# Patient Record
Sex: Female | Born: 1995 | Race: Black or African American | Hispanic: No | Marital: Single | Smoking: Never smoker
Health system: Southern US, Community
[De-identification: ages and names within clinical notes are randomized; demographics above are authoritative.]

## PROBLEM LIST (undated history)

## (undated) ENCOUNTER — Ambulatory Visit (HOSPITAL_COMMUNITY): Payer: Self-pay

## (undated) DIAGNOSIS — J45909 Unspecified asthma, uncomplicated: Secondary | ICD-10-CM

---
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
## 2015-01-24 ENCOUNTER — Telehealth: Payer: Self-pay | Admitting: *Deleted

## 2015-01-24 NOTE — Telephone Encounter (Signed)
VM from pt. States that her doctor asked her to call if she starts to have any pain, d/t Nexplanon. States that she has been having abdominal pain. [phone].   TC returned to pt. Pt states that she is having upper stomach pain, has been hurting for the past 5 days. Pt takes tylenol for the pain. States that this is the first time this has happened. States that she had a bm yesterday; no constipation concerns. Recommended pt take Ibuprofen, instead of tylenol for possible cramping. Pt is still able to work through the pain, not so severe that she is skipping activities. Recommended taking Ibuprofen for two days, waiting to see if cramping is occuring before period starts, and for pt to call back if pain worsens, and/or continues.

## 2015-04-02 IMAGING — CR DG CHEST 2V
2 series · 2 of 2 positions shown · non-contrast
Comparison: None.

CLINICAL DATA: Initial evaluation for acute chest pain radiating to
left arm. Dizziness, shortness of breath.

EXAM:
CHEST  2 VIEW

[w chest pa]
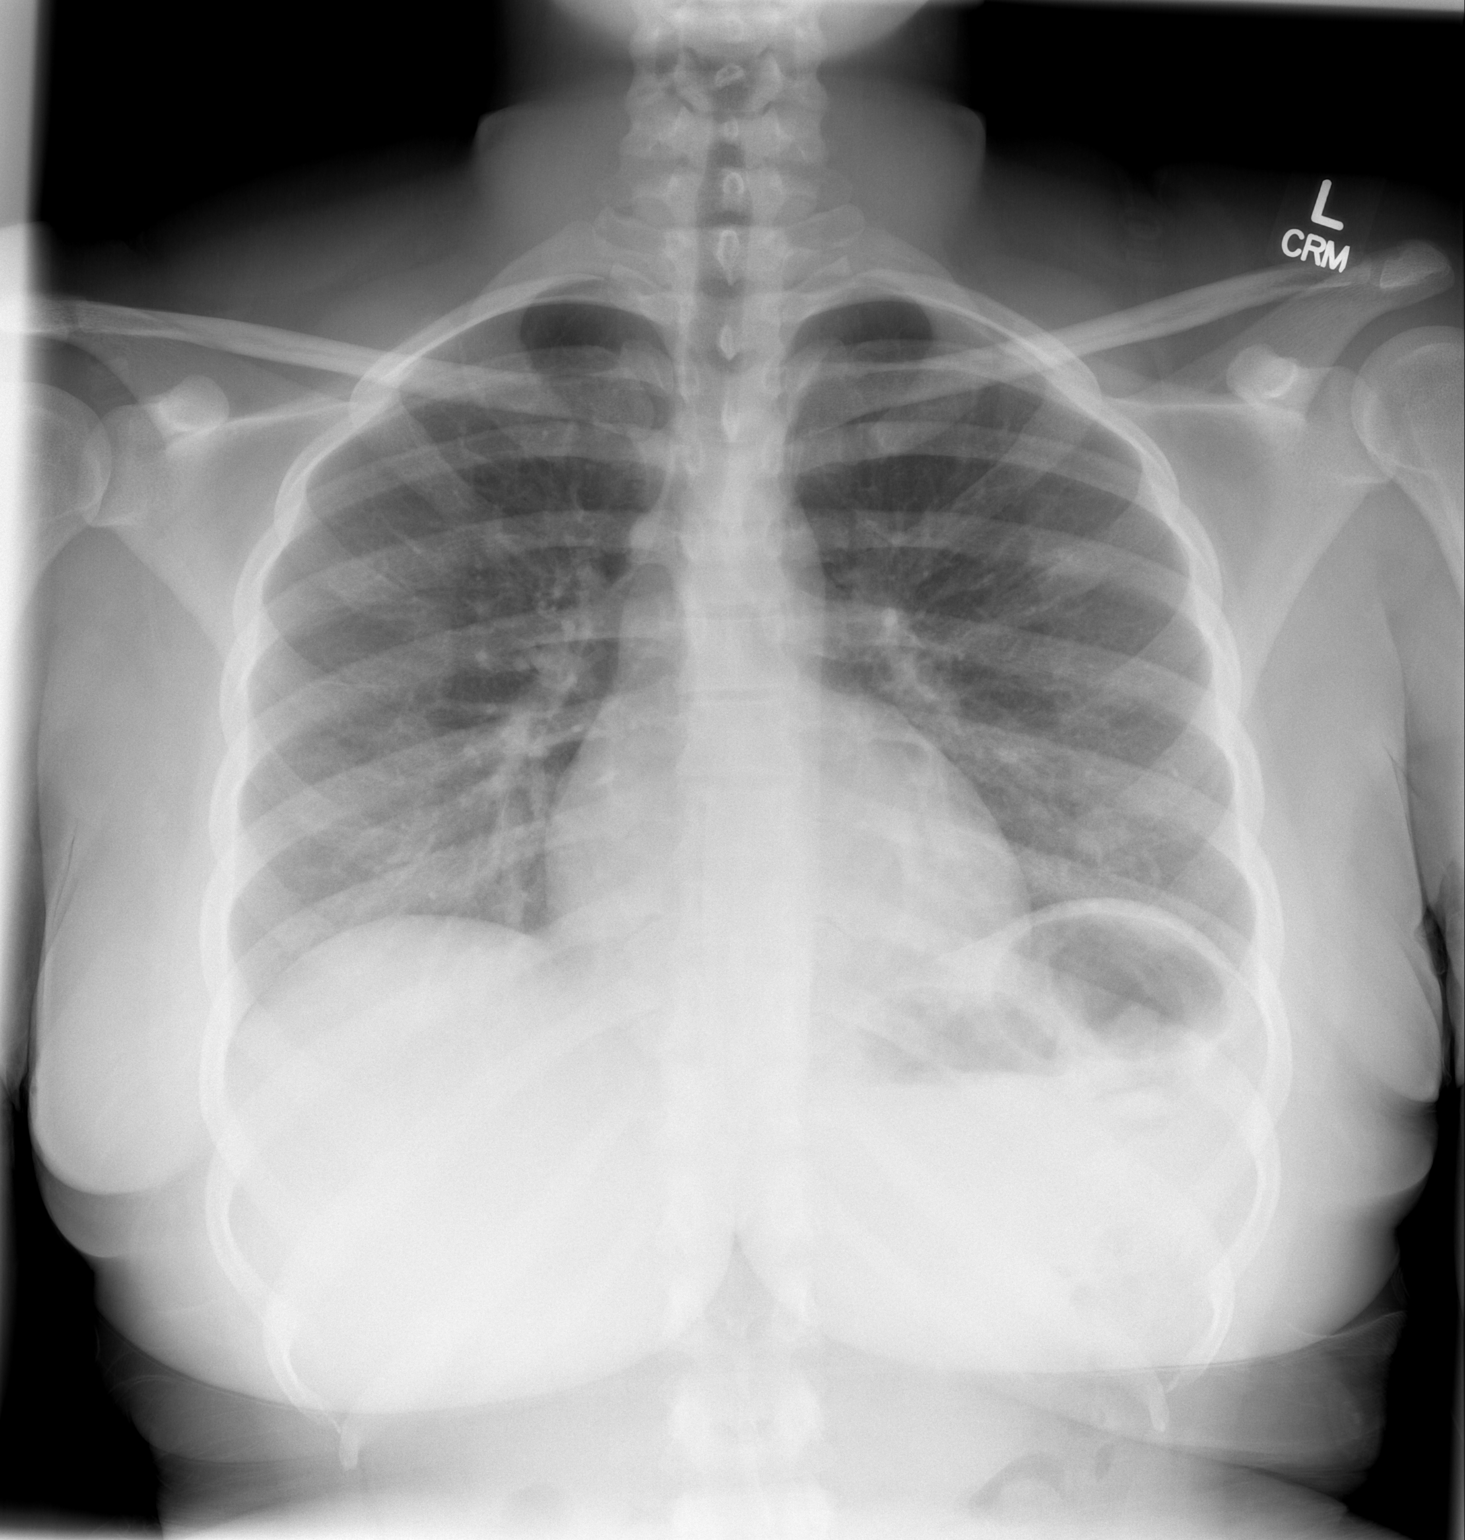

[w chest lat]
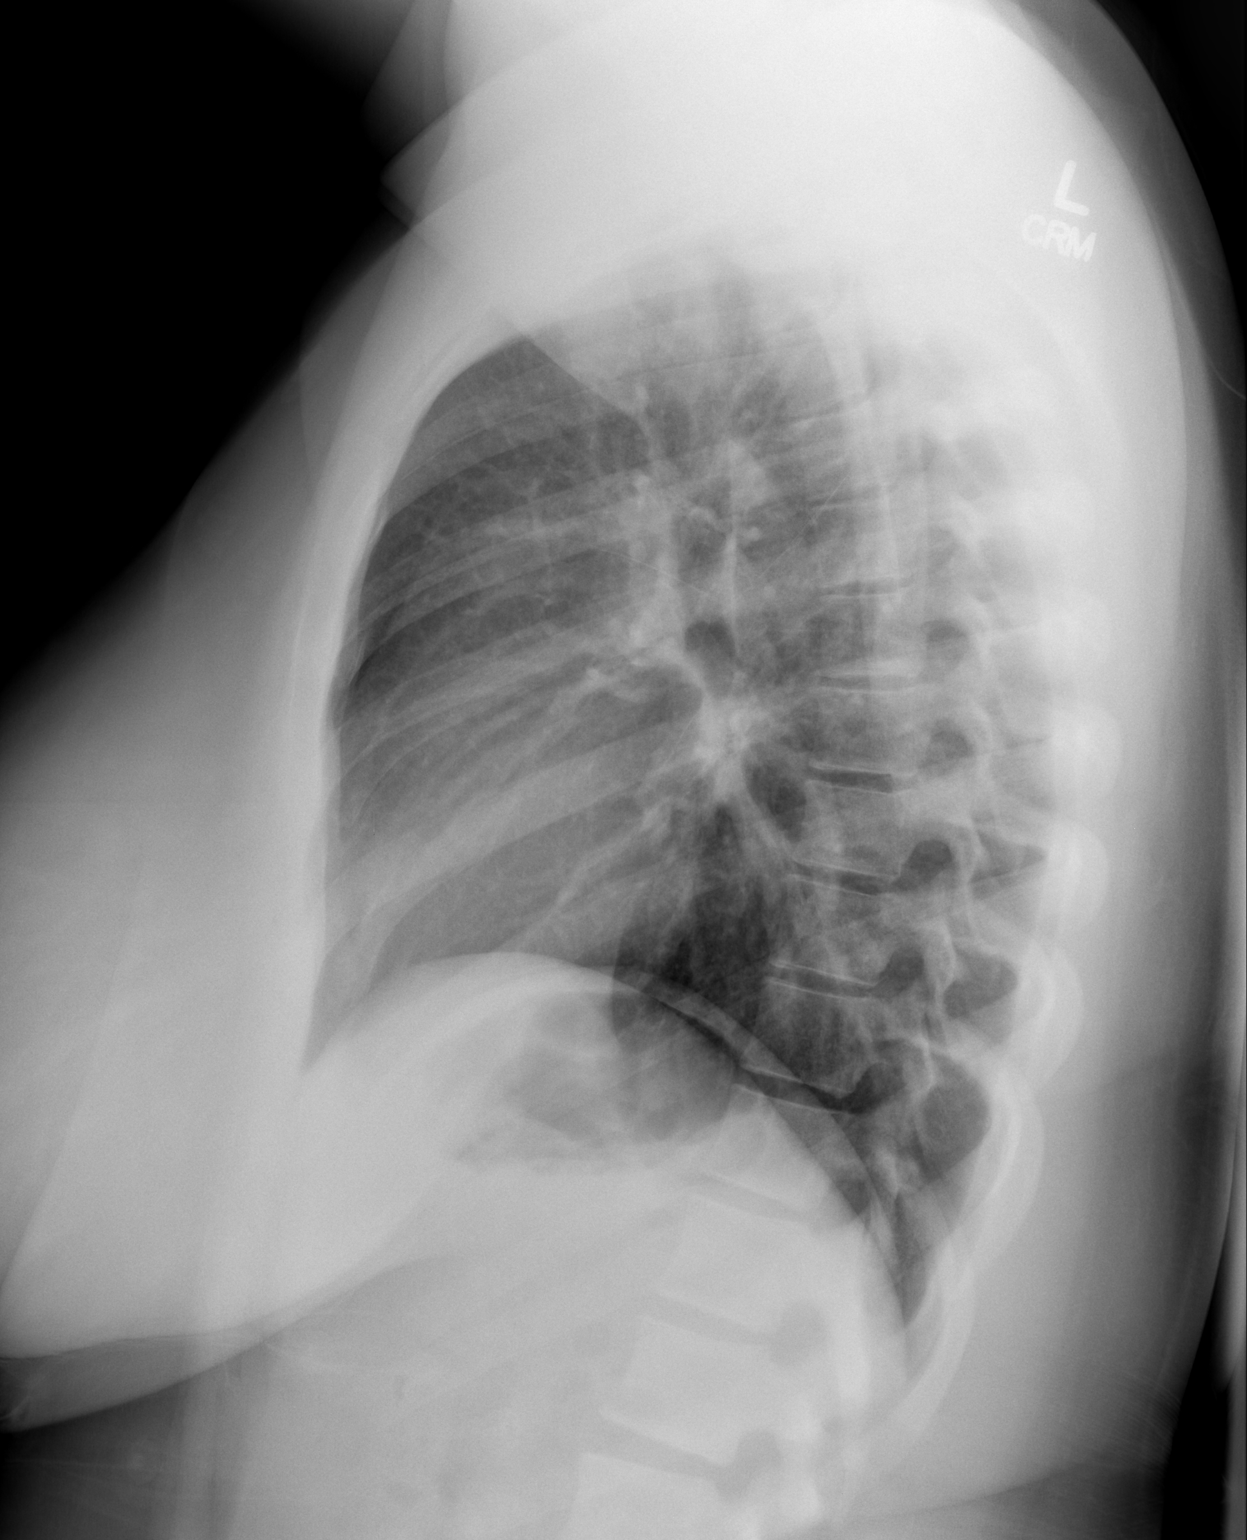

[2 of 2 positions shown; findings below may reference images not displayed]

FINDINGS: The cardiac and mediastinal silhouettes are within normal limits.

The lungs are normally inflated. No airspace consolidation, pleural
effusion, or pulmonary edema is identified. There is no
pneumothorax.

No acute osseous abnormality identified.
IMPRESSION: No active cardiopulmonary disease.

## 2015-05-22 ENCOUNTER — Encounter: Payer: Self-pay | Admitting: Pediatrics

## 2015-05-22 ENCOUNTER — Encounter: Payer: Self-pay | Admitting: Family

## 2015-05-22 ENCOUNTER — Ambulatory Visit (INDEPENDENT_AMBULATORY_CARE_PROVIDER_SITE_OTHER): Payer: Medicaid Other | Admitting: Family

## 2015-05-22 VITALS — BP 105/66 | HR 69 | Ht 63.5 in | Wt 208.2 lb

## 2015-05-22 DIAGNOSIS — N921 Excessive and frequent menstruation with irregular cycle: Secondary | ICD-10-CM | POA: Diagnosis not present

## 2015-05-22 DIAGNOSIS — Z113 Encounter for screening for infections with a predominantly sexual mode of transmission: Secondary | ICD-10-CM

## 2015-05-22 DIAGNOSIS — Z3046 Encounter for surveillance of implantable subdermal contraceptive: Secondary | ICD-10-CM | POA: Diagnosis not present

## 2015-05-22 DIAGNOSIS — Z130 Encounter for screening for diseases of the blood and blood-forming organs and certain disorders involving the immune mechanism: Secondary | ICD-10-CM

## 2015-05-22 DIAGNOSIS — E559 Vitamin D deficiency, unspecified: Secondary | ICD-10-CM | POA: Diagnosis not present

## 2015-05-22 DIAGNOSIS — D509 Iron deficiency anemia, unspecified: Secondary | ICD-10-CM

## 2015-05-22 DIAGNOSIS — Z975 Presence of (intrauterine) contraceptive device: Secondary | ICD-10-CM

## 2015-05-22 LAB — POCT HEMOGLOBIN: HEMOGLOBIN: 11.5 g/dL — AB (ref 12.2–16.2)

## 2015-05-22 MED ORDER — MELOXICAM 7.5 MG PO TABS: ORAL_TABLET | ORAL | Status: AC

## 2015-05-22 MED ORDER — FERROUS SULFATE 325 (65 FE) MG PO TABS: 325.0000 mg | ORAL_TABLET | Freq: Every day | ORAL | Status: DC

## 2015-05-22 NOTE — Progress Notes (Addendum)
THIS RECORD MAY CONTAIN CONFIDENTIAL INFORMATION THAT SHOULD NOT BE RELEASED WITHOUT REVIEW OF THE SERVICE PROVIDER.  Adolescent Medicine Consultation Follow-Up Visit Tiffany Randolph  is a 19 y.o. female referred by Kalman JewelsMcQueen, Shannon, MD here today for follow-up.    Previsit planning completed:  Yes Pre-Visit Planning  Tiffany JuneauRaionna Reinheimer  is a 19 y.o. female referred by Jairo BenMCQUEEN,SHANNON D, MD.   Last seen in Adolescent Medicine Clinic on 11/30/14 for nexplanon placement.   Previous Psych Screenings?  No  Treatment plan at last visit included nexplanon insertion.   Clinical Staff Visit Tasks:   - Urine GC/CT due? Urine and hold if new partner - Psych Screenings Due? No - fingerstick hemoglobin if heavy bleeding   Provider Visit Tasks: - assess nexplanon and any side effects  - eval vit d compliance and recheck if supplement complete - Pertinent Labs? Yes Results for orders placed or performed in visit on 11/30/14  HIV antibody  Result Value Ref Range   HIV 1&2 Ab, 4th Generation NONREACTIVE NONREACTIVE  POCT urine pregnancy  Result Value Ref Range   Preg Test, Ur Negative     Growth Chart Viewed? not applicable   History was provided by the patient.  PCP Confirmed?  Yes, Kalman JewelsShannon McQueen, MD   My Chart Activated?   Pending   HPI:    Three months ago started bleeding daily, after having Nexplanon inserted on 11/30/14. having to wear pantyliners. No true menstrual cycle.  Had kidney infection in August and was on ABXs and pain meds for that. Is wondering if spotting is related.  No vaginal discharge changes, odors, lesions, or new partners.  Mom wants her to ask when she will need a PAP.  ROS otherwise negative.    No LMP recorded. No Known Allergies Current Outpatient Prescriptions on File Prior to Visit  Medication Sig Dispense Refill  . Vitamin D, Ergocalciferol, (DRISDOL) 50000 UNITS CAPS capsule Take 1 capsule (50,000 Units total) by mouth every 7 (seven) days. 12  capsule 0   No current facility-administered medications on file prior to visit.    Social History: School:  in school [employer] in [place], [place]  Nutrition/Eating Behaviors:  No concerns Exercise:  Walking  Sleep:  awakens early, feels rested.   Confidentiality was discussed with the patient and if applicable, with caregiver as well.  Patient's personal or confidential phone number: [phone] Tobacco?  no Drugs/ETOH?  no Partner preference?  female Sexually Active?  yes   Pregnancy Prevention:  implant, reviewed condoms & plan B Safe at home, in school & in relationships?  Yes Safe to self?  Yes    The following portions of the patient's history were reviewed and updated as appropriate: allergies, current medications, past family history, past medical history, past social history, past surgical history and problem list.  Physical Exam:  Filed Vitals:   05/22/15 1048  BP: 105/66  Pulse: 69  Height: 5' 3.5" (1.613 m)  Weight: 208 lb 3.2 oz (94.439 kg)   BP 105/66 mmHg  Pulse 69  Ht 5' 3.5" (1.613 m)  Wt 208 lb 3.2 oz (94.439 kg)  BMI 36.30 kg/m2  LMP  (LMP Unknown) Body mass index: body mass index is 36.3 kg/(m^2). Blood pressure percentiles are 34% systolic and 58% diastolic based on 2000 NHANES data. Blood pressure percentile targets: 90: 123/78, 95: 127/82, 99 + 5 mmHg: 139/94.  Physical Exam  Constitutional: She is oriented to person, place, and time. She appears well-developed. No distress.  HENT:  Head: Normocephalic and atraumatic.  Eyes: EOM are normal. Pupils are equal, round, and reactive to light. No scleral icterus.  Neck: Normal range of motion. Neck supple. No thyromegaly present.  Cardiovascular: Normal rate, regular rhythm, normal heart sounds and intact distal pulses.   No murmur heard. Pulmonary/Chest: Effort normal and breath sounds normal.  Abdominal: Soft.  Musculoskeletal: Normal range of motion. She exhibits no edema.  Lymphadenopathy:     She has no cervical adenopathy.  Neurological: She is alert and oriented to person, place, and time. No cranial nerve deficit.  Skin: Skin is warm and dry. No rash noted.  Psychiatric: She has a normal mood and affect. Her behavior is normal. Judgment and thought content normal.  Vitals reviewed.    Assessment/Plan:  1. Breakthrough bleeding on Nexplanon -likely r/t Nexplanon.  -discussed Mobic use or OCPs; pt elects to try Mobic first.  - meloxicam (MOBIC) 7.5 MG tablet; Take 1 tablet (7.5mg ) by mouth with food once daily for 10 days.  Dispense: 30 tablet; Refill: 0  2. Iron deficiency anemia - ferrous sulfate ([facility] IRON) 325 (65 FE) MG tablet; Take 1 tablet (325 mg total) by mouth daily with breakfast.  Dispense: 30 tablet; Refill: 3  3. Vitamin D deficiency - 9 on 12/01/14; states she took Vit D from Pharmacy and was out in the sun during summer.  -Will recheck today and advise.  - VITAMIN D 25 Hydroxy (Vit-D Deficiency, Fractures)  4. Surveillance of implantable subdermal contraceptive -no concerns other than BTB -condom use reviewed; condoms given.   5. Screening for iron deficiency anemia 11.5; begin Fe supplements; iron-rich foods. Mobic to control BTB on Nexplanon.  - POCT hemoglobin  6. Routine screening for STI (sexually transmitted infection) -r/o cause of BTB - GC/chlamydia probe amp, urine  Discussed PAP guidelines with patient; screenings start at 19 yo. Patient verbalized understanding with no further questions.   Follow-up:  Return in about 4 weeks (around 06/19/2015), or schedule when she is home for winter break, for medication follow-up, GYN/Reproductive Health concerns.   Medical decision-making:  > 15 minutes spent, more than 50% of appointment was spent discussing diagnosis and management of symptoms

## 2015-05-22 NOTE — Progress Notes (Signed)
Pre-Visit Planning  Tiffany Randolph  is a 19 y.o. female referred by Jairo BenMCQUEEN,SHANNON D, MD.   Last seen in Adolescent Medicine Clinic on 11/30/14 for nexplanon placement.   Previous Psych Screenings?  No  Treatment plan at last visit included nexplanon insertion.   Clinical Staff Visit Tasks:   - Urine GC/CT due? Urine and hold if new partner - Psych Screenings Due? No - fingerstick hemoglobin if heavy bleeding   Provider Visit Tasks: - assess nexplanon and any side effects  - eval vit d compliance and recheck if supplement complete - Pertinent Labs? Yes Results for orders placed or performed in visit on 11/30/14  HIV antibody  Result Value Ref Range   HIV 1&2 Ab, 4th Generation NONREACTIVE NONREACTIVE  POCT urine pregnancy  Result Value Ref Range   Preg Test, Ur Negative

## 2015-05-23 LAB — GC/CHLAMYDIA PROBE AMP, URINE
Chlamydia, Swab/Urine, PCR: NEGATIVE
GC Probe Amp, Urine: NEGATIVE

## 2015-05-23 LAB — VITAMIN D 25 HYDROXY (VIT D DEFICIENCY, FRACTURES): Vit D, 25-Hydroxy: 10 ng/mL — ABNORMAL LOW (ref 30–100)

## 2015-05-27 ENCOUNTER — Telehealth: Payer: Self-pay | Admitting: *Deleted

## 2015-05-27 NOTE — Telephone Encounter (Signed)
-----   Message from Christianne Dolinhristy Millican, NP sent at 05/23/2015  6:01 PM EST ----- Negative gc/c. Notify patient.

## 2015-05-27 NOTE — Telephone Encounter (Signed)
TC to pt updated of GC/C. Pt verbalized understanding, had question regarding her vit D level. Checked vit D level, advised pt it was still low, and to pick up vit D to take daily until next OV.

## 2015-06-17 ENCOUNTER — Encounter: Payer: Self-pay | Admitting: Family

## 2015-06-17 NOTE — Progress Notes (Signed)
Opened in error. See Documentation note from today with PVP.

## 2015-06-17 NOTE — Progress Notes (Signed)
Patient ID: Fidela JuneauRaionna Piacente, female   DOB: 05/13/1996, 19 y.o.   MRN: [id number] Pre-Visit Planning  Fidela JuneauRaionna Ells  is a 19 y.o. female referred by Jairo BenMCQUEEN,SHANNON D, MD.   Last seen in Adolescent Medicine Clinic on 05/22/15 for BTB on Nexplanon.       Treatment plan at last visit included Mobic for bleeding control, iron supplementation. Vit D level (10) - will need to start high-dose Vit D supplementation.   Clinical Staff Visit Tasks:   - Urine GC/CT due? no - Psych Screenings Due? no - fs hgb      Provider Visit Tasks: - Review Vit D results with patient; discuss high-dose supplementation -reassess BTB, Mobic use, consider OCPs if still bleeding - [facility] Inv[facility]ment? No - Pertinent Labs? Yes, vit D (10)

## 2015-06-18 ENCOUNTER — Ambulatory Visit: Payer: Medicaid Other | Admitting: Family

## 2016-01-31 ENCOUNTER — Encounter: Payer: Self-pay | Admitting: Family

## 2016-01-31 ENCOUNTER — Ambulatory Visit (INDEPENDENT_AMBULATORY_CARE_PROVIDER_SITE_OTHER): Payer: Medicaid Other | Admitting: Family

## 2016-01-31 VITALS — BP 109/77 | HR 63 | Ht 64.0 in | Wt 233.6 lb

## 2016-01-31 DIAGNOSIS — R6889 Other general symptoms and signs: Secondary | ICD-10-CM

## 2016-01-31 DIAGNOSIS — Z3202 Encounter for pregnancy test, result negative: Secondary | ICD-10-CM

## 2016-01-31 DIAGNOSIS — R631 Polydipsia: Secondary | ICD-10-CM

## 2016-01-31 DIAGNOSIS — R94120 Abnormal auditory function study: Secondary | ICD-10-CM

## 2016-01-31 DIAGNOSIS — R635 Abnormal weight gain: Secondary | ICD-10-CM | POA: Diagnosis not present

## 2016-01-31 DIAGNOSIS — H579 Unspecified disorder of eye and adnexa: Secondary | ICD-10-CM | POA: Diagnosis not present

## 2016-01-31 DIAGNOSIS — Z0001 Encounter for general adult medical examination with abnormal findings: Secondary | ICD-10-CM

## 2016-01-31 DIAGNOSIS — Z0101 Encounter for examination of eyes and vision with abnormal findings: Secondary | ICD-10-CM

## 2016-01-31 DIAGNOSIS — Z113 Encounter for screening for infections with a predominantly sexual mode of transmission: Secondary | ICD-10-CM | POA: Diagnosis not present

## 2016-01-31 LAB — CBC WITH DIFFERENTIAL/PLATELET
BASOS PCT: 1 %
Basophils Absolute: 49 cells/uL (ref 0–200)
EOS ABS: 98 {cells}/uL (ref 15–500)
EOS PCT: 2 %
HCT: 38.3 % (ref 35.0–45.0)
Hemoglobin: 12.4 g/dL (ref 11.7–15.5)
Lymphocytes Relative: 31 %
Lymphs Abs: 1519 cells/uL (ref 850–3900)
MCH: 28.9 pg (ref 27.0–33.0)
MCHC: 32.4 g/dL (ref 32.0–36.0)
MCV: 89.3 fL (ref 80.0–100.0)
MONOS PCT: 6 %
MPV: 9.3 fL (ref 7.5–12.5)
Monocytes Absolute: 294 cells/uL (ref 200–950)
NEUTROS ABS: 2940 {cells}/uL (ref 1500–7800)
Neutrophils Relative %: 60 %
PLATELETS: 343 10*3/uL (ref 140–400)
RBC: 4.29 MIL/uL (ref 3.80–5.10)
RDW: 13 % (ref 11.0–15.0)
WBC: 4.9 10*3/uL (ref 3.8–10.8)

## 2016-01-31 LAB — TSH: TSH: 1 m[IU]/L (ref 0.50–4.30)

## 2016-01-31 LAB — COMPREHENSIVE METABOLIC PANEL
ALT: 12 U/L (ref 5–32)
AST: 13 U/L (ref 12–32)
Albumin: 3.6 g/dL (ref 3.6–5.1)
Alkaline Phosphatase: 54 U/L (ref 47–176)
BUN: 9 mg/dL (ref 7–20)
CHLORIDE: 105 mmol/L (ref 98–110)
CO2: 27 mmol/L (ref 20–31)
CREATININE: 0.66 mg/dL (ref 0.50–1.00)
Calcium: 8.9 mg/dL (ref 8.9–10.4)
Glucose, Bld: 83 mg/dL (ref 65–99)
Potassium: 4.5 mmol/L (ref 3.8–5.1)
SODIUM: 136 mmol/L (ref 135–146)
TOTAL PROTEIN: 6.6 g/dL (ref 6.3–8.2)
Total Bilirubin: 0.3 mg/dL (ref 0.2–1.1)

## 2016-01-31 LAB — LIPID PANEL
CHOLESTEROL: 169 mg/dL (ref 125–170)
HDL: 46 mg/dL (ref 36–76)
LDL Cholesterol: 113 mg/dL — ABNORMAL HIGH (ref <110)
Total CHOL/HDL Ratio: 3.7 Ratio (ref <=5.0)
Triglycerides: 49 mg/dL (ref 40–136)
VLDL: 10 mg/dL (ref <30)

## 2016-01-31 LAB — HEMOGLOBIN A1C
Hgb A1c MFr Bld: 5.7 % — ABNORMAL HIGH (ref <5.7)
MEAN PLASMA GLUCOSE: 117 mg/dL

## 2016-01-31 NOTE — Progress Notes (Signed)
Routine Well-Adolescent Visit   History was provided by the patient.  Tiffany Randolph is a 20 y.o. female who is here for routine wellness exam. PCP Confirmed?  yes  Jairo Ben, MD  Previsit planning completed:  no  Growth Chart Viewed? no  HPI:  Nexplanon has been fine; had spotting before - now just having a regular cycle every 2 months, lasting about a week. No cramping. Not currently sexually active.   About 3 months ago had rash on both inner thighs - similar occurrence at 35 yo - grandmother gave her diabetic lotion and it went away. It was itchy, completely resolved.   CC: dad has ALS, concerning. Dad wants her to be checked. Vitamin D unsure if still low. Missed last opthalmology referral.  Dental Care: last week. Root canal needed soon.   Patient's last menstrual period was 01/29/2016 (exact date).  Menstrual History:   ROS:  Review of Systems  Constitutional: Negative.   HENT: Negative for nosebleeds, sore throat and tinnitus.   Eyes: Positive for blurred vision. Negative for photophobia, pain and discharge.  Respiratory: Negative.   Cardiovascular: Negative.   Gastrointestinal: Negative.   Genitourinary: Negative.   Musculoskeletal: Negative.   Skin: Negative.   Neurological: Negative.  Negative for headaches.  Endo/Heme/Allergies: Positive for polydipsia (3 x nightly ).  Psychiatric/Behavioral: The patient has insomnia.    The following portions of the patient's history were reviewed and updated as appropriate: allergies, current medications, past family history, past medical history, past social history, past surgical history and problem list.  No Known Allergies  Past Medical History:   Mild asthma - rescue inhaler only, no recent use.  No other medications.  No surgeries,  Past Medical History:  Diagnosis Date  . Asthma     Family History:  Father: ALS Maternal Aunt and Maternal GGM: breast cancer Paternal GM, + two of GM's siblings  No  family history on file.  Social History: Lives with: mom, 2 sis, stepdad, dog Parental relations: great Siblings: 8 sister, 1 brother Friends/Peers: good  School: spring at [employer]  Futrure Plans: Engineer, maintenance (IT) Nutrition/Eating Behaviors: picky about what she eats, eats what she likes - veggies; french fries, fried chicken  Sports/Exercise:  Gym, starting going a week ago  Screen time: mostly all day on phone Sleep: still not great; tried to change routine with phone; tries not to watch TV. Takes Tylenol PM - not daily - approx 10 pm sleep - wakes up throughout night - 5 hrs total   Confidentiality was discussed with the patient and if applicable, with caregiver as well.  Patient's personal or confidential phone number: [phone] Tobacco? no Secondhand smoke exposure?no Drugs/EtOH?no Sexually active?no Pregnancy Prevention: nexplanon, reviewed condoms & plan B Safe at home, in school & in relationships? Yes Guns in the home? no Safe to self? Yes  Physical Exam:  Vitals:   01/31/16 1002  BP: 109/77  Pulse: 63  Weight: 233 lb 9.6 oz (106 kg)  Height:  (1.626 m)   BP 109/77 (BP Location: Left Arm, Patient Position: Sitting, Cuff Size: Large)   Pulse 63   Ht  (1.626 m)   Wt 233 lb 9.6 oz (106 kg)   LMP 01/29/2016 (Exact Date)   BMI 40.10 kg/m  Body mass index: body mass index is 40.1 kg/m.  Blood pressure percentiles are 53.3 % systolic and 91.0 % diastolic based on NHBPEP's 4th Report.   Physical Exam  Constitutional: She is oriented to person,  place, and time. She appears well-developed and well-nourished. No distress.  Obese   Eyes: EOM are normal. Pupils are equal, round, and reactive to light. No scleral icterus.  Neck: Normal range of motion. Neck supple. No thyromegaly present.  Cardiovascular: Normal rate, regular rhythm, normal heart sounds and intact distal pulses.   No murmur heard. Pulmonary/Chest: Effort normal and breath sounds normal.   Abdominal: Soft. There is no tenderness. There is no guarding.  Musculoskeletal: Normal range of motion. She exhibits no edema or tenderness.  Lymphadenopathy:    She has no cervical adenopathy.  Neurological: She is alert and oriented to person, place, and time. No cranial nerve deficit.  Skin: Skin is warm and dry. No rash noted.  Acanthosis nigricans - posterior neck folds  Psychiatric: She has a normal mood and affect.  Nursing note and vitals reviewed.  Assessment/Plan: 1. Encounter for general adult medical examination with abnormal findings -significant findings today include concern for insulin resistance, failed auditory and vision screenings. Will refer.  -reviewed labs to collect today; will call with results.    2. Failed hearing screening -refer to audiology   3. Failed vision screen -refer to opthalmology   4. Polydipsia -concern for prediabetes; reviewed risk factors.  - Hemoglobin A1c - Lipid panel - CBC with Differential/Platelet  5. Pregnancy examination or test, negative result -Per protocol  - POCT urine pregnancy  6. Weight gain -monitoring labs; reviewed lifestyle changes related to exercise/diet  - Comprehensive metabolic panel - TSH  7. Routine screening for STI (sexually transmitted infection) Per protocol  - GC/Chlamydia Probe Amp - HIV antibody    Return pending lab results. Otherwise, as needed. ..  Letitia Neri, NP

## 2016-02-01 LAB — GC/CHLAMYDIA PROBE AMP
CT Probe RNA: NOT DETECTED
GC Probe RNA: NOT DETECTED

## 2016-02-01 LAB — HIV ANTIBODY (ROUTINE TESTING W REFLEX): HIV: NONREACTIVE

## 2016-02-11 ENCOUNTER — Telehealth: Payer: Self-pay

## 2016-02-11 NOTE — Telephone Encounter (Signed)
Called patient back after they were wanting results of labs from a few weeks ago. Was disconnected in between conversation bullet pt know her results are available on mychart for review. Called patient back after disconnect, no answer , left VM.

## 2016-04-28 ENCOUNTER — Ambulatory Visit: Payer: Medicaid Other | Attending: Family | Admitting: Audiology

## 2017-03-31 ENCOUNTER — Ambulatory Visit: Payer: Medicaid Other | Admitting: Pediatrics

## 2018-07-06 DIAGNOSIS — Z0110 Encounter for examination of ears and hearing without abnormal findings: Secondary | ICD-10-CM | POA: Diagnosis not present

## 2018-07-06 DIAGNOSIS — Z136 Encounter for screening for cardiovascular disorders: Secondary | ICD-10-CM | POA: Diagnosis not present

## 2018-07-06 DIAGNOSIS — Z0100 Encounter for examination of eyes and vision without abnormal findings: Secondary | ICD-10-CM | POA: Diagnosis not present

## 2018-07-06 DIAGNOSIS — Z131 Encounter for screening for diabetes mellitus: Secondary | ICD-10-CM | POA: Diagnosis not present

## 2018-07-06 DIAGNOSIS — N3000 Acute cystitis without hematuria: Secondary | ICD-10-CM | POA: Diagnosis not present

## 2018-07-06 DIAGNOSIS — Z0000 Encounter for general adult medical examination without abnormal findings: Secondary | ICD-10-CM | POA: Diagnosis not present

## 2018-11-29 DIAGNOSIS — E559 Vitamin D deficiency, unspecified: Secondary | ICD-10-CM | POA: Diagnosis not present

## 2019-01-05 DIAGNOSIS — Z113 Encounter for screening for infections with a predominantly sexual mode of transmission: Secondary | ICD-10-CM | POA: Diagnosis not present

## 2019-01-05 DIAGNOSIS — Z01419 Encounter for gynecological examination (general) (routine) without abnormal findings: Secondary | ICD-10-CM | POA: Diagnosis not present

## 2019-01-05 DIAGNOSIS — Z3046 Encounter for surveillance of implantable subdermal contraceptive: Secondary | ICD-10-CM | POA: Diagnosis not present

## 2019-01-05 DIAGNOSIS — Z1151 Encounter for screening for human papillomavirus (HPV): Secondary | ICD-10-CM | POA: Diagnosis not present

## 2019-01-12 DIAGNOSIS — Z3040 Encounter for surveillance of contraceptives, unspecified: Secondary | ICD-10-CM | POA: Diagnosis not present

## 2019-01-12 DIAGNOSIS — Z3046 Encounter for surveillance of implantable subdermal contraceptive: Secondary | ICD-10-CM | POA: Diagnosis not present

## 2019-03-24 DIAGNOSIS — Z23 Encounter for immunization: Secondary | ICD-10-CM | POA: Diagnosis not present

## 2019-03-24 DIAGNOSIS — N920 Excessive and frequent menstruation with regular cycle: Secondary | ICD-10-CM | POA: Diagnosis not present

## 2019-03-24 DIAGNOSIS — E559 Vitamin D deficiency, unspecified: Secondary | ICD-10-CM | POA: Diagnosis not present

## 2019-07-20 DIAGNOSIS — Z0000 Encounter for general adult medical examination without abnormal findings: Secondary | ICD-10-CM | POA: Diagnosis not present

## 2019-07-20 DIAGNOSIS — E559 Vitamin D deficiency, unspecified: Secondary | ICD-10-CM | POA: Diagnosis not present

## 2019-07-25 DIAGNOSIS — Z1331 Encounter for screening for depression: Secondary | ICD-10-CM | POA: Diagnosis not present

## 2019-07-25 DIAGNOSIS — R21 Rash and other nonspecific skin eruption: Secondary | ICD-10-CM | POA: Diagnosis not present

## 2019-07-25 DIAGNOSIS — Z0000 Encounter for general adult medical examination without abnormal findings: Secondary | ICD-10-CM | POA: Diagnosis not present

## 2019-07-25 DIAGNOSIS — E559 Vitamin D deficiency, unspecified: Secondary | ICD-10-CM | POA: Diagnosis not present

## 2019-07-25 DIAGNOSIS — A749 Chlamydial infection, unspecified: Secondary | ICD-10-CM | POA: Diagnosis not present

## 2019-07-26 DIAGNOSIS — Z0000 Encounter for general adult medical examination without abnormal findings: Secondary | ICD-10-CM | POA: Diagnosis not present

## 2019-07-26 DIAGNOSIS — E559 Vitamin D deficiency, unspecified: Secondary | ICD-10-CM | POA: Diagnosis not present

## 2019-10-27 DIAGNOSIS — E559 Vitamin D deficiency, unspecified: Secondary | ICD-10-CM | POA: Diagnosis not present

## 2019-10-27 DIAGNOSIS — L309 Dermatitis, unspecified: Secondary | ICD-10-CM | POA: Diagnosis not present

## 2019-12-27 ENCOUNTER — Ambulatory Visit (INDEPENDENT_AMBULATORY_CARE_PROVIDER_SITE_OTHER): Payer: Medicaid Other

## 2019-12-27 DIAGNOSIS — Z23 Encounter for immunization: Secondary | ICD-10-CM

## 2020-01-17 ENCOUNTER — Ambulatory Visit: Payer: Medicaid Other

## 2020-01-22 ENCOUNTER — Ambulatory Visit (INDEPENDENT_AMBULATORY_CARE_PROVIDER_SITE_OTHER): Payer: Self-pay

## 2020-01-22 ENCOUNTER — Other Ambulatory Visit: Payer: Self-pay

## 2020-01-22 VITALS — Wt 226.8 lb

## 2020-01-22 DIAGNOSIS — Z23 Encounter for immunization: Secondary | ICD-10-CM

## 2020-01-22 NOTE — Progress Notes (Signed)
   Covid-19 Vaccination Clinic  Name:  Tiffany Randolph    MRN: [id number] DOB: Jun 11, 1996  01/22/2020  [name] was observed post Covid-19 immunization for 15 minutes without incident. She was provided with Vaccine Information Sheet and instruction to access the V-Safe system.   [name] was instructed to call 911 with any severe reactions post vaccine: Marland Kitchen Difficulty breathing  . Swelling of face and throat  . A fast heartbeat  . A bad rash all over body  . Dizziness and weakness   Immunizations Administered    Name Date Dose VIS Date Route   Pfizer COVID-19 Vaccine 01/22/2020  9:25 AM 0.3 mL 08/23/2018 Intramuscular   Manufacturer: [employer], [employer]   Lot: [id number]   NDC: [address]-9

## 2020-01-30 ENCOUNTER — Encounter: Payer: Self-pay | Admitting: Pediatrics

## 2022-02-19 DIAGNOSIS — Z113 Encounter for screening for infections with a predominantly sexual mode of transmission: Secondary | ICD-10-CM | POA: Diagnosis not present

## 2022-02-19 DIAGNOSIS — R519 Headache, unspecified: Secondary | ICD-10-CM | POA: Diagnosis not present

## 2022-02-19 DIAGNOSIS — Z136 Encounter for screening for cardiovascular disorders: Secondary | ICD-10-CM | POA: Diagnosis not present

## 2022-02-19 DIAGNOSIS — K219 Gastro-esophageal reflux disease without esophagitis: Secondary | ICD-10-CM | POA: Diagnosis not present

## 2022-02-19 DIAGNOSIS — Z0001 Encounter for general adult medical examination with abnormal findings: Secondary | ICD-10-CM | POA: Diagnosis not present

## 2022-02-19 DIAGNOSIS — Z131 Encounter for screening for diabetes mellitus: Secondary | ICD-10-CM | POA: Diagnosis not present

## 2022-02-26 DIAGNOSIS — Z136 Encounter for screening for cardiovascular disorders: Secondary | ICD-10-CM | POA: Diagnosis not present

## 2022-02-26 DIAGNOSIS — Z0001 Encounter for general adult medical examination with abnormal findings: Secondary | ICD-10-CM | POA: Diagnosis not present

## 2022-02-26 DIAGNOSIS — Z113 Encounter for screening for infections with a predominantly sexual mode of transmission: Secondary | ICD-10-CM | POA: Diagnosis not present

## 2022-02-26 DIAGNOSIS — Z131 Encounter for screening for diabetes mellitus: Secondary | ICD-10-CM | POA: Diagnosis not present

## 2022-05-01 DIAGNOSIS — L210 Seborrhea capitis: Secondary | ICD-10-CM | POA: Diagnosis not present

## 2022-05-01 DIAGNOSIS — D649 Anemia, unspecified: Secondary | ICD-10-CM | POA: Diagnosis not present

## 2022-05-01 DIAGNOSIS — E785 Hyperlipidemia, unspecified: Secondary | ICD-10-CM | POA: Diagnosis not present

## 2022-05-01 DIAGNOSIS — Z124 Encounter for screening for malignant neoplasm of cervix: Secondary | ICD-10-CM | POA: Diagnosis not present

## 2022-05-01 DIAGNOSIS — Z0001 Encounter for general adult medical examination with abnormal findings: Secondary | ICD-10-CM | POA: Diagnosis not present

## 2022-05-01 DIAGNOSIS — Z23 Encounter for immunization: Secondary | ICD-10-CM | POA: Diagnosis not present

## 2022-06-02 DIAGNOSIS — K219 Gastro-esophageal reflux disease without esophagitis: Secondary | ICD-10-CM | POA: Diagnosis not present

## 2022-06-02 DIAGNOSIS — D649 Anemia, unspecified: Secondary | ICD-10-CM | POA: Diagnosis not present

## 2022-06-02 DIAGNOSIS — Z793 Long term (current) use of hormonal contraceptives: Secondary | ICD-10-CM | POA: Diagnosis not present

## 2022-06-11 DIAGNOSIS — Z01419 Encounter for gynecological examination (general) (routine) without abnormal findings: Secondary | ICD-10-CM | POA: Diagnosis not present

## 2022-06-11 DIAGNOSIS — Z1151 Encounter for screening for human papillomavirus (HPV): Secondary | ICD-10-CM | POA: Diagnosis not present

## 2022-06-11 DIAGNOSIS — Z8042 Family history of malignant neoplasm of prostate: Secondary | ICD-10-CM | POA: Diagnosis not present

## 2022-06-11 DIAGNOSIS — Z124 Encounter for screening for malignant neoplasm of cervix: Secondary | ICD-10-CM | POA: Diagnosis not present

## 2022-06-11 DIAGNOSIS — Z803 Family history of malignant neoplasm of breast: Secondary | ICD-10-CM | POA: Diagnosis not present

## 2022-06-11 DIAGNOSIS — Z6841 Body Mass Index (BMI) 40.0 and over, adult: Secondary | ICD-10-CM | POA: Diagnosis not present

## 2022-06-25 DIAGNOSIS — Z3046 Encounter for surveillance of implantable subdermal contraceptive: Secondary | ICD-10-CM | POA: Diagnosis not present

## 2022-07-17 DIAGNOSIS — K219 Gastro-esophageal reflux disease without esophagitis: Secondary | ICD-10-CM | POA: Diagnosis not present

## 2022-07-17 DIAGNOSIS — Z793 Long term (current) use of hormonal contraceptives: Secondary | ICD-10-CM | POA: Diagnosis not present

## 2022-07-17 DIAGNOSIS — D649 Anemia, unspecified: Secondary | ICD-10-CM | POA: Diagnosis not present

## 2022-08-06 ENCOUNTER — Ambulatory Visit: Payer: BC Managed Care – PPO | Admitting: Plastic Surgery

## 2022-08-06 ENCOUNTER — Encounter: Payer: Self-pay | Admitting: Plastic Surgery

## 2022-08-06 VITALS — BP 106/75 | HR 92 | Ht 63.0 in | Wt 261.2 lb

## 2022-08-06 DIAGNOSIS — N62 Hypertrophy of breast: Secondary | ICD-10-CM | POA: Diagnosis not present

## 2022-08-06 DIAGNOSIS — Z6841 Body Mass Index (BMI) 40.0 and over, adult: Secondary | ICD-10-CM

## 2022-08-06 DIAGNOSIS — M546 Pain in thoracic spine: Secondary | ICD-10-CM

## 2022-08-06 DIAGNOSIS — M542 Cervicalgia: Secondary | ICD-10-CM | POA: Diagnosis not present

## 2022-08-06 NOTE — Progress Notes (Signed)
Referring Provider Eben Burow, NP [place],  [place] [id number]   CC:  Chief Complaint  Patient presents with   Consult           Tiffany Randolph is an 27 y.o. female.  HPI: Tiffany Randolph is a 27 year old female who presents today with complaints of upper back and neck pain shoulder grooving from her bra straps due to the large size of her breast.  She is requesting a breast reduction.  No Known Allergies  Outpatient Encounter Medications as of 08/06/2022  Medication Sig   phentermine (ADIPEX-P) 37.5 MG tablet Take 37.5 mg by mouth daily.   No facility-administered encounter medications on file as of 08/06/2022.     Past Medical History:  Diagnosis Date   Asthma     No past surgical history on file.  No family history on file.  Social History   Social History Narrative   Not on file     Review of Systems General: Denies fevers, chills, weight loss CV: Denies chest pain, shortness of breath, palpitations Breast: Patient states that the large size of her breasts are contributing to her upper back and neck pain.  She denies any other specific issues with her breasts.  Physical Exam    08/06/2022    3:29 PM 01/22/2020    9:28 AM 01/31/2016   10:02 AM  Vitals with BMI  Height 5\' 3"   5\' 4"   Weight 261 lbs 3 oz 226 lbs 13 oz 233 lbs 10 oz  BMI 54.62  70.3  Systolic 500  938  Diastolic 75  77  Pulse 92  63    General:  No acute distress,  Alert and oriented, Non-Toxic, Normal speech and affect Breast: Patient has extremely large breasts that are pendulous with grade 3 ptosis.  There are no obvious abnormalities and the nipples are normal in appearance. Mammogram: Patient is 27 years old and has not begun mammographic screening Assessment/Plan Symptomatic macromastia: Patient complains of upper back and neck pain which she attributes to the large size of her breasts.  I believe this is probably true.  Her breasts are quite large and heavy and  she would likely benefit from a breast reduction.  Time the patient is a BMI of 46.  I have asked that she work on weight loss and try to at least get down to a BMI of 40 before we schedule surgery.  I do believe that she will still require breast reduction even at this weight but I believe that surgery will be safer for both for the patient and for complications from the surgery. We discussed the procedure at length including the location of the incisions and the unpredictable nature of scarring.  We discussed the risks of bleeding, infection, and seroma formation.  We discussed the increased risk of nipple loss due to nipple ischemia and wound healing complications due to the large size of her breasts.  We discussed the possibility of a free nipple graft.  We discussed the postoperative limitations including no heavy lifting greater than 20 pounds, no vigorous activity, and no submerging the incisions in water for 6 weeks.  She may return to light activity as tolerated postoperatively and she is encouraged to begin ambulating as soon as possible after surgery to prevent DVT. I did discuss with her at length strategies for weight loss including walking and light weight lifting as well as avoidance of processed foods and foods containing fructose.  We discussed the importance of healthy proteins as well as fruits and vegetables as a primary part of her diet. She will be referred to healthy weight and wellness and to physical therapy and will return to see me when her weight approach is a BMI of 40 which would be approximately a 30 pound weight loss for this patient.  We will obtain measurements and photographs at her next appointment.  [name] 08/06/2022, 5:18 PM

## 2022-10-01 DIAGNOSIS — L218 Other seborrheic dermatitis: Secondary | ICD-10-CM | POA: Diagnosis not present

## 2023-01-22 DIAGNOSIS — Z803 Family history of malignant neoplasm of breast: Secondary | ICD-10-CM | POA: Diagnosis not present

## 2023-01-22 DIAGNOSIS — Z309 Encounter for contraceptive management, unspecified: Secondary | ICD-10-CM | POA: Diagnosis not present

## 2023-02-05 DIAGNOSIS — M545 Low back pain, unspecified: Secondary | ICD-10-CM | POA: Diagnosis not present

## 2023-02-05 DIAGNOSIS — J029 Acute pharyngitis, unspecified: Secondary | ICD-10-CM | POA: Diagnosis not present

## 2023-02-05 DIAGNOSIS — N62 Hypertrophy of breast: Secondary | ICD-10-CM | POA: Diagnosis not present

## 2023-06-04 DIAGNOSIS — M545 Low back pain, unspecified: Secondary | ICD-10-CM | POA: Diagnosis not present

## 2023-06-04 DIAGNOSIS — D649 Anemia, unspecified: Secondary | ICD-10-CM | POA: Diagnosis not present

## 2023-06-04 DIAGNOSIS — L210 Seborrhea capitis: Secondary | ICD-10-CM | POA: Diagnosis not present

## 2023-06-11 DIAGNOSIS — Z113 Encounter for screening for infections with a predominantly sexual mode of transmission: Secondary | ICD-10-CM | POA: Diagnosis not present

## 2023-06-11 DIAGNOSIS — Z13220 Encounter for screening for lipoid disorders: Secondary | ICD-10-CM | POA: Diagnosis not present

## 2023-06-11 DIAGNOSIS — Z131 Encounter for screening for diabetes mellitus: Secondary | ICD-10-CM | POA: Diagnosis not present

## 2023-06-11 DIAGNOSIS — D649 Anemia, unspecified: Secondary | ICD-10-CM | POA: Diagnosis not present

## 2023-06-15 DIAGNOSIS — Z0001 Encounter for general adult medical examination with abnormal findings: Secondary | ICD-10-CM | POA: Diagnosis not present

## 2023-06-15 DIAGNOSIS — D509 Iron deficiency anemia, unspecified: Secondary | ICD-10-CM | POA: Diagnosis not present

## 2023-06-15 DIAGNOSIS — Z23 Encounter for immunization: Secondary | ICD-10-CM | POA: Diagnosis not present

## 2023-06-15 DIAGNOSIS — N62 Hypertrophy of breast: Secondary | ICD-10-CM | POA: Diagnosis not present

## 2023-06-15 DIAGNOSIS — Z124 Encounter for screening for malignant neoplasm of cervix: Secondary | ICD-10-CM | POA: Diagnosis not present

## 2023-06-15 DIAGNOSIS — R9230 Dense breasts, unspecified: Secondary | ICD-10-CM | POA: Diagnosis not present

## 2023-06-16 ENCOUNTER — Encounter: Payer: Self-pay | Admitting: Physical Therapy

## 2023-06-16 ENCOUNTER — Ambulatory Visit: Payer: BC Managed Care – PPO | Attending: Family Medicine | Admitting: Physical Therapy

## 2023-06-16 DIAGNOSIS — R293 Abnormal posture: Secondary | ICD-10-CM | POA: Diagnosis not present

## 2023-06-16 DIAGNOSIS — M5459 Other low back pain: Secondary | ICD-10-CM | POA: Diagnosis not present

## 2023-06-16 NOTE — Patient Instructions (Signed)
  Aquatic Therapy: What to Expect!  Where:  [facility] [address] [place], [place]  [address] [phone]  NOTE:  You will receive an automated phone message reminding you of your appointment and it will say the appointment is at the Rehab Center on [address]  We are working to fix this- just know that you will meet us at the pool!  How to Prepare: Please make sure you drink 8 ounces of water about one hour prior to your pool session A caregiver MUST attend the entire session with the patient.  The caregiver will be responsible for assisting with dressing as well as any toileting needs.  If the patient will be doing a home program this should likely be the person who will assist as well.  Patients must wear either their street shoes or pool shoes until they are ready to enter the pool with the therapist.  Patients must also wear either street shoes or pool shoes once exiting the pool to walk to the locker room.  This will helps us prevent slips and falls.  Please arrive 15 minutes early to prepare for your pool therapy session Sign in at the front desk on the clipboard marked for [facility] You may use the locker rooms on your right and then enter directly into the recreation pool (NOT the competition pool) Please make sure to attend to any toileting needs prior to entering the pool Please be dressed in your swim suit and on the pool deck at least 5 minutes before your appointment Once on the pool deck your therapist will ask you to sign the Patient  Consent and Assignment of Benefits form Your therapist may take your blood pressure prior to, during and after your session if indicated  About the pool  and parking: Entering the pool Your therapist will assist you; there are 2 ways to enter:  stairs with railings or with a chair lift.   Your therapist will determine the most appropriate way for you. Water temperature is usually between 86-87 degrees There  may be other swimmers in the pool at the same time Parking is free.   Contact Info:     Appointments: [facility]  All sessions are 45 minutes   [address]  [address]     Please call the [facility] if   [place], [place]   [address]    you need to cancel or reschedule an appointment.  [phone]       

## 2023-06-16 NOTE — Therapy (Signed)
OUTPATIENT PHYSICAL THERAPY THORACOLUMBAR EVALUATION   Patient Name: Tiffany Randolph MRN: [id number] DOB:June 25, 1996, 27 y.o., female Today's Date: 06/16/2023  END OF SESSION:  PT End of Session - 06/16/23 1614     Visit Number 1    Number of Visits 3    Date for PT Re-Evaluation 07/16/23    Authorization Type BCBS    PT Start Time 1448    PT Stop Time 1520    PT Time Calculation (min) 32 min    Activity Tolerance Patient tolerated treatment well    Behavior During Therapy WFL for tasks assessed/performed                Past Medical History:  Diagnosis Date   Asthma    History reviewed. No pertinent surgical history. Patient Active Problem List   Diagnosis Date Noted   Vitamin D deficiency 12/10/2014   Surveillance of implantable subdermal contraceptive 11/30/2014   BMI (body mass index), pediatric, greater than or equal to 95% for age 52/25/2016   Failed vision screen 10/22/2014   Failed hearing screening 10/22/2014   Sleep difficulties 10/22/2014    PCP: Loney Laurence, NP  REFERRING PROVIDER: Loney Laurence, NP  REFERRING DIAG: M54.50 (ICD-10-CM) - Low back pain, unspecified  Rationale for Evaluation and Treatment: Rehabilitation  THERAPY DIAG:  Other low back pain  Abnormal posture  ONSET DATE: 02/12/2023  SUBJECTIVE:                                                                                                                                                                                           SUBJECTIVE STATEMENT: Reports a while back she wanted to get a breast reduction surgery, but insurance wouldn't cover it without therapy. Reports she needed to come to therapy first before insurance will cover her therapy. Has back pain from this. Can't sit up properly and is always slouching. Has been getting massages and it helps for 2 weeks and then it hurts again. Just basic everyday stuff will make it worse. Wears a back brace and that does  help alleviates the pain. Saw the surgeon about getting a breast reduction and she saw them in February of 2024. Per plastic surgeon, wants pt to work on weight loss and try to get down to a BMI of 40 before scheduling surgery. Was also referred to healthy weight and wellness.   PERTINENT HISTORY:  PMH: Obesity, Low back pain (attributed to macromastia)  PAIN:  Are you having pain? Yes: NPRS scale: 6/10 Pain location: Upper back Pain description: Sharp  Aggravating factors: Macromastia Relieving factors: Getting massages, back brace  Low back  pain comes here and there  PRECAUTIONS: None  RED FLAGS: None   WEIGHT BEARING RESTRICTIONS: No  OCCUPATION: Works from home   PLOF: Independent  PATIENT GOALS: Wants to gain some tools to help back pain in the mean time   NEXT MD VISIT: None scheduled  OBJECTIVE:  Note: Objective measures were completed at Evaluation unless otherwise noted.  PATIENT SURVEYS:  Modified Oswestry 12/50 =24%, moderate disability in regards to low back pain    COGNITION: Overall cognitive status: Within functional limits for tasks assessed     SENSATION: WFL Light touch: WFL  MUSCLE LENGTH: Hamstrings: Slightly limited, but pt wearing jeans, pt reporting no tightness    POSTURE: rounded shoulders, forward head, and posterior pelvic tilt  PALPATION: TTP R>L rhomboids and parascapular musculature, lumbar paraspinals   LUMBAR ROM:   AROM eval  Flexion Approx. Top 1/4 of shins, with tightness in back  Extension WFL, pt reports pain in top shoulders   Right lateral flexion WFL  Left lateral flexion WFL  Right rotation WFL  Left rotation WFL   (Blank rows = not tested)  LOWER EXTREMITY ROM:     Active  Right eval Left eval  Hip flexion Slightly limited, but pt wearing jeans Slightly limited, but pt wearing jeans  Hip extension    Hip abduction    Hip adduction    Hip internal rotation [facility]  Hip external rotation [facility]  Knee  flexion    Knee extension    Ankle dorsiflexion    Ankle plantarflexion    Ankle inversion    Ankle eversion     (Blank rows = not tested)  LOWER EXTREMITY MMT:    MMT Right eval Left eval  Hip flexion 5 5  Hip extension 5 5  Hip abduction 5 5  Hip adduction    Hip internal rotation    Hip external rotation    Knee flexion 5 5  Knee extension 5 5  Ankle dorsiflexion 5 5  Ankle plantarflexion    Ankle inversion    Ankle eversion     (Blank rows = not tested)   FUNCTIONAL TESTS:  5 times sit to stand: 15.4 seconds no UE support  30 seconds chair stand test: 10 sit <> stands with no UE support   GAIT: Distance walked: Clinic distances  Assistive device utilized: None Level of assistance: Complete Independence   TREATMENT DATE:  N/A during eval.                                                                                                                                  PATIENT EDUCATION:  Education details: Encouraged pt to follow up with plastic surgery office about new referral to healthy weight and wellness (was referred in February from last appt, but was not able to get scheduled due to incorrect phone number), discussed PT can provide temporary relief potentially for back pain  with exercises, but ultimately breast reduction surgery will significantly reduce pt's pain. Encouraged pt to exercise due to pt needing to lose weight before surgeon will perform breast reduction surgery. Pt is a member at [employer], so discussed going to aquatic therapy for 1 visit to give exercises that pt can perform at the [facility].  Person educated: Patient Education method: Explanation Education comprehension: verbalized understanding  HOME EXERCISE PROGRAM: Will provide at future session for aquatic and land therapy.   ASSESSMENT:  CLINICAL IMPRESSION: Patient is a 27 year old female referred to Neuro OPPT for low/upper back pain.   Pt's PMH is significant for: Obesity, Low back  pain (attributed to macromastia). Pt has back pain due to macromastia. Pt has followed up with a plastic surgeon regarding this for getting a breast reduction, but per surgeon, pt should try to get her BMI down from a 46 to a 40 until they schedule the surgery. Pt was referred to healthy weight and wellness, but pt unable to get scheduled. Encouraged pt to follow-up with plastic surgeon's office for new referral (pt seen in Feb of 2024). Pt reports she currently needs PT in order for insurance to cover her surgery.  Per research, PT can help provide temporary relief by improving posture, strengthening muscles, and giving exercises to manage pain, but is not enough to address the underlying issue, with pt would need breast reduction surgery for lasting improvements. The following deficits were present during the exam: TTP to upper and low back, postural abnormalities, upper and low back pain. PT will work on exercises to help improve posture, strengthening her muscles, and stretching exercises to help temporarily relieve pain. Pt also has a membership at [employer], so will only need one visit to obtain an HEP to work on independently at [employer].    OBJECTIVE IMPAIRMENTS: decreased activity tolerance, increased muscle spasms, postural dysfunction, and pain.   ACTIVITY LIMITATIONS: sitting and standing  PARTICIPATION LIMITATIONS:  N/A  PERSONAL FACTORS: Fitness and 1-2 comorbidities: Obesity, Macromastia  are also affecting patient's functional outcome.   REHAB POTENTIAL: Fair due to pt ultimately needing to undergo a breast reduction surgery to help with back pain  CLINICAL DECISION MAKING: Stable/uncomplicated  EVALUATION COMPLEXITY: Low   GOALS: Goals reviewed with patient? Yes  SHORT TERM GOALS: ALL STGS = LTGS  LONG TERM GOALS: Target date: 07/14/2023  Pt will be independent with final HEP for land and aquatic therapy for pt to help temporarily try to manage pain with upper/low back  exercises until pt is at a better weight to undergo breast reduction surgery to decr back pain.  Baseline:  Goal status: INITIAL  2.  Pt will decr ODI to an 8/50 or less in order to demo decr disability in regards to low back pain.  Baseline:  Goal status: INITIAL  3.  Pt will improve 30 second chair stand to at least 12 sit <> stands in order to demo improved functional strength.  Baseline: 10 sit <> stands Goal status: INITIAL   PLAN:  PT FREQUENCY: 1-2x/week  PT DURATION: 4 weeks - only a few visits necessary, depending on scheduling   PLANNED INTERVENTIONS: 97164- PT Re-evaluation, 97110-Therapeutic exercises, 97530- Therapeutic activity, [id number]- Neuromuscular re-education, 97535- Self Care, [id number]- Manual therapy, [id number]- Aquatic Therapy, Patient/Family education, Cryotherapy, and Moist heat.  PLAN FOR NEXT SESSION: Land PT: give HEP for low/upper back stretches and core exercises  Aquatic PT: give HEP (pt is member of the [facility] and has access  to a gym) for back exercises and aerobic exercises that pt can do in the pool    Drake Leach, PT, DPT 06/16/2023, 4:44 PM

## 2023-06-18 ENCOUNTER — Encounter: Payer: Self-pay | Admitting: Family Medicine

## 2023-06-18 ENCOUNTER — Other Ambulatory Visit: Payer: Self-pay | Admitting: Family Medicine

## 2023-06-18 DIAGNOSIS — Z1231 Encounter for screening mammogram for malignant neoplasm of breast: Secondary | ICD-10-CM

## 2023-06-18 DIAGNOSIS — R9230 Dense breasts, unspecified: Secondary | ICD-10-CM

## 2023-07-06 ENCOUNTER — Encounter: Payer: Self-pay | Admitting: Rehabilitation

## 2023-07-06 ENCOUNTER — Ambulatory Visit: Payer: BC Managed Care – PPO | Admitting: Rehabilitation

## 2023-07-06 NOTE — Therapy (Deleted)
 OUTPATIENT PHYSICAL THERAPY THORACOLUMBAR EVALUATION   Patient Name: Tiffany Randolph MRN: [id number] DOB:06/27/1996, 28 y.o., female Today's Date: 07/06/2023  END OF SESSION:  PT End of Session - 07/06/23 0733     Visit Number 2    Number of Visits 3    Date for PT Re-Evaluation 07/16/23    Authorization Type BCBS    Equipment Utilized During Treatment Other (comment)   floatation devices as needed for safety   Activity Tolerance Patient tolerated treatment well    Behavior During Therapy [facility] for tasks assessed/performed                Past Medical History:  Diagnosis Date   Asthma    History reviewed. No pertinent surgical history. Patient Active Problem List   Diagnosis Date Noted   Vitamin D  deficiency 12/10/2014   Surveillance of implantable subdermal contraceptive 11/30/2014   BMI (body mass index), pediatric, greater than or equal to 95% for age 78/25/2016   Failed vision screen 10/22/2014   Failed hearing screening 10/22/2014   Sleep difficulties 10/22/2014    PCP: Cecilia Kevin MATSU, NP  REFERRING PROVIDER: Cecilia Kevin MATSU, NP  REFERRING DIAG: M54.50 (ICD-10-CM) - Low back pain, unspecified  Rationale for Evaluation and Treatment: Rehabilitation  THERAPY DIAG:  Other low back pain  Abnormal posture  ONSET DATE: 02/12/2023  SUBJECTIVE:                                                                                                                                                                                           SUBJECTIVE STATEMENT: Reports a while back she wanted to get a breast reduction surgery, but insurance wouldn't cover it without therapy. Reports she needed to come to therapy first before insurance will cover her therapy. Has back pain from this. Can't sit up properly and is always slouching. Has been getting massages and it helps for 2 weeks and then it hurts again. Just basic everyday stuff will make it worse. Wears a back brace and  that does help alleviates the pain. Saw the surgeon about getting a breast reduction and she saw them in February of 2024. Per plastic surgeon, wants pt to work on weight loss and try to get down to a BMI of 40 before scheduling surgery. Was also referred to healthy weight and wellness.   PERTINENT HISTORY:  PMH: Obesity, Low back pain (attributed to macromastia)  PAIN:  Are you having pain? Yes: NPRS scale: 6/10 Pain location: Upper back Pain description: Sharp  Aggravating factors: Macromastia Relieving factors: Getting massages, back brace  Low back pain comes here and there  PRECAUTIONS:  None  RED FLAGS: None   WEIGHT BEARING RESTRICTIONS: No  OCCUPATION: Works from home   PLOF: Independent  PATIENT GOALS: Wants to gain some tools to help back pain in the mean time   NEXT MD VISIT: None scheduled  OBJECTIVE:  Note: Objective measures were completed at Evaluation unless otherwise noted.  PATIENT SURVEYS:  Modified Oswestry 12/50 =24%, moderate disability in regards to low back pain    COGNITION: Overall cognitive status: Within functional limits for tasks assessed     SENSATION: WFL Light touch: WFL  MUSCLE LENGTH: Hamstrings: Slightly limited, but pt wearing jeans, pt reporting no tightness    POSTURE: rounded shoulders, forward head, and posterior pelvic tilt  PALPATION: TTP R>L rhomboids and parascapular musculature, lumbar paraspinals   LUMBAR ROM:   AROM eval  Flexion Approx. Top 1/4 of shins, with tightness in back  Extension WFL, pt reports pain in top shoulders   Right lateral flexion WFL  Left lateral flexion WFL  Right rotation WFL  Left rotation WFL   (Blank rows = not tested)  LOWER EXTREMITY ROM:     Active  Right eval Left eval  Hip flexion Slightly limited, but pt wearing jeans Slightly limited, but pt wearing jeans  Hip extension    Hip abduction    Hip adduction    Hip internal rotation [facility]  Hip external rotation [facility]   Knee flexion    Knee extension    Ankle dorsiflexion    Ankle plantarflexion    Ankle inversion    Ankle eversion     (Blank rows = not tested)  LOWER EXTREMITY MMT:    MMT Right eval Left eval  Hip flexion 5 5  Hip extension 5 5  Hip abduction 5 5  Hip adduction    Hip internal rotation    Hip external rotation    Knee flexion 5 5  Knee extension 5 5  Ankle dorsiflexion 5 5  Ankle plantarflexion    Ankle inversion    Ankle eversion     (Blank rows = not tested)   FUNCTIONAL TESTS:  5 times sit to stand: 15.4 seconds no UE support  30 seconds chair stand test: 10 sit <> stands with no UE support   GAIT: Distance walked: Clinic distances  Assistive device utilized: None Level of assistance: Complete Independence   TREATMENT DATE:   Patient seen for aquatic therapy today.  Treatment took place in water 3.6-4.0 feet deep depending upon activity.  Pt entered and exited the pool stairs with rails as needed for support.  Pool temp approx 92 degs.   Warm up:   Pt requires buoyancy of water for support for reduced fall risk and for unloading/reduced stress on joints (spine) as pt able to tolerate increased standing and ambulation in water compared to that on land; viscosity of water is needed for resistance for strengthening and current of water provides perturbations for challenge for balance training  PATIENT EDUCATION:  Education details: aquatic rationale and HEP  Person educated: Patient Education method: Explanation Education comprehension: verbalized understanding  HOME EXERCISE PROGRAM: Will provide at future session for aquatic and land therapy.   ASSESSMENT:  CLINICAL IMPRESSION: Patient presents for single session in pool today to get HEP in order to continue aquatic therapy on her own at [facility] to help alleviate back pain while  awaiting breast reduction surgery.     OBJECTIVE IMPAIRMENTS: decreased activity tolerance, increased muscle spasms, postural dysfunction, and pain.   ACTIVITY LIMITATIONS: sitting and standing  PARTICIPATION LIMITATIONS:  N/A  PERSONAL FACTORS: Fitness and 1-2 comorbidities: Obesity, Macromastia  are also affecting patient's functional outcome.   REHAB POTENTIAL: Fair due to pt ultimately needing to undergo a breast reduction surgery to help with back pain  CLINICAL DECISION MAKING: Stable/uncomplicated  EVALUATION COMPLEXITY: Low   GOALS: Goals reviewed with patient? Yes  SHORT TERM GOALS: ALL STGS = LTGS  LONG TERM GOALS: Target date: 07/14/2023  Pt will be independent with final HEP for land and aquatic therapy for pt to help temporarily try to manage pain with upper/low back exercises until pt is at a better weight to undergo breast reduction surgery to decr back pain.  Baseline:  Goal status: INITIAL  2.  Pt will decr ODI to an 8/50 or less in order to demo decr disability in regards to low back pain.  Baseline:  Goal status: INITIAL  3.  Pt will improve 30 second chair stand to at least 12 sit <> stands in order to demo improved functional strength.  Baseline: 10 sit <> stands Goal status: INITIAL   PLAN:  PT FREQUENCY: 1-2x/week  PT DURATION: 4 weeks - only a few visits necessary, depending on scheduling   PLANNED INTERVENTIONS: 97164- PT Re-evaluation, 97110-Therapeutic exercises, 97530- Therapeutic activity, [id number]- Neuromuscular re-education, 97535- Self Care, [id number]- Manual therapy, [id number]- Aquatic Therapy, Patient/Family education, Cryotherapy, and Moist heat.  PLAN FOR NEXT SESSION: Land PT: give HEP for low/upper back stretches and core exercises  Aquatic PT: give HEP (pt is member of the [employer] and has access to a gym) for back exercises and aerobic exercises that pt can do in the pool    Damien Fought, PT, MPT [facility] [address] [place], [place], [address] Phone: [phone]   Fax:  [phone] 07/06/23, 7:34 AM

## 2023-07-09 ENCOUNTER — Ambulatory Visit: Payer: BC Managed Care – PPO | Attending: Family Medicine | Admitting: Physical Therapy

## 2023-08-26 DIAGNOSIS — N62 Hypertrophy of breast: Secondary | ICD-10-CM | POA: Diagnosis not present

## 2023-08-26 DIAGNOSIS — D509 Iron deficiency anemia, unspecified: Secondary | ICD-10-CM | POA: Diagnosis not present

## 2023-08-26 DIAGNOSIS — M545 Low back pain, unspecified: Secondary | ICD-10-CM | POA: Diagnosis not present

## 2023-09-23 DIAGNOSIS — M545 Low back pain, unspecified: Secondary | ICD-10-CM | POA: Diagnosis not present

## 2023-09-23 DIAGNOSIS — N62 Hypertrophy of breast: Secondary | ICD-10-CM | POA: Diagnosis not present

## 2023-09-23 DIAGNOSIS — R9230 Dense breasts, unspecified: Secondary | ICD-10-CM | POA: Diagnosis not present

## 2023-09-24 ENCOUNTER — Other Ambulatory Visit: Payer: Self-pay | Admitting: Family Medicine

## 2023-09-24 DIAGNOSIS — R9230 Dense breasts, unspecified: Secondary | ICD-10-CM

## 2024-02-05 ENCOUNTER — Other Ambulatory Visit: Payer: Self-pay

## 2024-02-05 ENCOUNTER — Emergency Department (HOSPITAL_COMMUNITY)
Admission: EM | Admit: 2024-02-05 | Discharge: 2024-02-06 | Disposition: A | Discharge status: Home / Self Care | Payer: Self-pay | Attending: Emergency Medicine | Admitting: Emergency Medicine

## 2024-02-05 ENCOUNTER — Encounter (HOSPITAL_COMMUNITY): Payer: Self-pay

## 2024-02-05 DIAGNOSIS — F12120 Cannabis abuse with intoxication, uncomplicated: Secondary | ICD-10-CM | POA: Insufficient documentation

## 2024-02-05 DIAGNOSIS — F12920 Cannabis use, unspecified with intoxication, uncomplicated: Secondary | ICD-10-CM

## 2024-02-05 DIAGNOSIS — D649 Anemia, unspecified: Secondary | ICD-10-CM | POA: Insufficient documentation

## 2024-02-05 DIAGNOSIS — J45909 Unspecified asthma, uncomplicated: Secondary | ICD-10-CM | POA: Insufficient documentation

## 2024-02-05 MED ORDER — SODIUM CHLORIDE 0.9 % IV BOLUS
1000.0000 mL | Freq: Once | INTRAVENOUS | Status: AC
  Administered 2024-02-06: 1000 mL via INTRAVENOUS

## 2024-02-05 NOTE — ED Provider Notes (Signed)
  [place] EMERGENCY DEPARTMENT AT [facility] Provider Note   CSN: [id number] Arrival date & time: 02/05/24  2308     Patient presents with: Ingestion   Tiffany Randolph is a 28 y.o. female with history of asthma.  Patient presents to ED for evaluation of ingestion.  Reports that prior to arrival she drank a quarter of a 10 mg THC beverage she purchased from a local grocery store.  She denies that she ever ingests THC drinks.  She does report that she often smokes marijuana, usually on weekends.  Reports that she also is been drinking alcohol tonight, 1 beat box.  States that after ingesting the THC beverage, she became paranoid, unable to focus and was having a hard time answering questions asked to her by EMS personnel.  She denies any pain.  Denies any chest pain, shortness of breath, abdominal pain, nausea or vomiting.  Reports LMP was 2 weeks ago.  Reports he does not often mix alcohol with marijuana.  Denies any other drug use tonight.  She is alert and oriented x 4.  Not responding to internal stimuli.  Denies SI, HI, AVH.   Ingestion       Prior to Admission medications   Medication Sig Start Date End Date Taking? Authorizing Provider  Naltrexone-buPROPion HCl ER (CONTRAVE) 8-90 MG TB12 Take by mouth.    [provider]  phentermine (ADIPEX-P) 37.5 MG tablet Take 37.5 mg by mouth daily. 07/17/22   [provider]    Allergies: Patient has no known allergies.    Review of Systems  Updated Vital Signs There were no vitals taken for this visit.  Physical Exam  (all labs ordered are listed, but only abnormal results are displayed) Labs Reviewed  RAPID URINE DRUG SCREEN, HOSP PERFORMED  CBC  BASIC METABOLIC PANEL WITH GFR  HCG, SERUM, QUALITATIVE  ETHANOL    EKG: None  Radiology: No results found.  {Document cardiac monitor, telemetry assessment procedure when appropriate:32947} Procedures   Medications Ordered in the ED  sodium  chloride 0.9 % bolus 1,000 mL (has no administration in time range)      {Click here for ABCD2, HEART and other calculators REFRESH Note before signing:1}                              Medical Decision Making Amount and/or Complexity of Data Reviewed Labs: ordered. ECG/medicine tests: ordered.   ***  {Document critical care time when appropriate  Document review of labs and clinical decision tools ie CHADS2VASC2, etc  Document your independent review of radiology images and any outside records  Document your discussion with family members, caretakers and with consultants  Document social determinants of health affecting pt's care  Document your decision making why or why not admission, treatments were needed:32947:::1}   Final diagnoses:  None    ED Discharge Orders     None

## 2024-02-05 NOTE — ED Triage Notes (Signed)
 Pt. Bib gcems for ingestion of a thc drink. The bottle had 60 mg of thc in it. States that she only took 1 sip. Since she has felt sleepy. Per EMS pt. Had conversations but would forget that they had the conversation.

## 2024-02-06 LAB — BASIC METABOLIC PANEL WITH GFR
Anion gap: 11 (ref 5–15)
BUN: 16 mg/dL (ref 6–20)
CO2: 26 mmol/L (ref 22–32)
Calcium: 9.2 mg/dL (ref 8.9–10.3)
Chloride: 102 mmol/L (ref 98–111)
Creatinine, Ser: 0.97 mg/dL (ref 0.44–1.00)
GFR, Estimated: >60 mL/min (ref >60)
Glucose, Bld: 79 mg/dL (ref 70–99)
Potassium: 3.7 mmol/L (ref 3.5–5.1)
Sodium: 139 mmol/L (ref 135–145)

## 2024-02-06 LAB — CBC
HCT: 35.5 % — ABNORMAL LOW (ref 36.0–46.0)
Hemoglobin: 10.7 g/dL — ABNORMAL LOW (ref 12.0–15.0)
MCH: 26.6 pg (ref 26.0–34.0)
MCHC: 30.1 g/dL (ref 30.0–36.0)
MCV: 88.1 fL (ref 80.0–100.0)
Platelets: 424 K/uL — ABNORMAL HIGH (ref 150–400)
RBC: 4.03 MIL/uL (ref 3.87–5.11)
RDW: 13.6 % (ref 11.5–15.5)
WBC: 7.5 K/uL (ref 4.0–10.5)
nRBC: 0 % (ref 0.0–0.2)

## 2024-02-06 LAB — RAPID URINE DRUG SCREEN, HOSP PERFORMED
Amphetamines: NOT DETECTED
Barbiturates: NOT DETECTED
Benzodiazepines: NOT DETECTED
Cocaine: NOT DETECTED
Opiates: NOT DETECTED
Tetrahydrocannabinol: POSITIVE — AB

## 2024-02-06 LAB — ETHANOL: Alcohol, Ethyl (B): <15 mg/dL (ref <15)

## 2024-02-06 LAB — HCG, SERUM, QUALITATIVE: Preg, Serum: NEGATIVE

## 2024-02-06 NOTE — Discharge Instructions (Signed)
 It was a pleasure taking part in your care.  As discussed, your workup here is reassuring.  I believe that your symptoms are most likely secondary to consuming the [facility] beverage.  Please discontinue use of THC beverages.  Please follow-up with your PCP.  Return to ED with any new symptoms.

## 2024-07-05 ENCOUNTER — Emergency Department (HOSPITAL_BASED_OUTPATIENT_CLINIC_OR_DEPARTMENT_OTHER): Payer: Self-pay

## 2024-07-05 ENCOUNTER — Other Ambulatory Visit: Payer: Self-pay

## 2024-07-05 ENCOUNTER — Emergency Department (HOSPITAL_BASED_OUTPATIENT_CLINIC_OR_DEPARTMENT_OTHER)
Admission: EM | Admit: 2024-07-05 | Discharge: 2024-07-05 | Disposition: A | Discharge status: Home / Self Care | Payer: Self-pay | Attending: Emergency Medicine | Admitting: Emergency Medicine

## 2024-07-05 ENCOUNTER — Encounter (HOSPITAL_BASED_OUTPATIENT_CLINIC_OR_DEPARTMENT_OTHER): Payer: Self-pay

## 2024-07-05 DIAGNOSIS — Z203 Contact with and (suspected) exposure to rabies: Secondary | ICD-10-CM | POA: Insufficient documentation

## 2024-07-05 DIAGNOSIS — Z23 Encounter for immunization: Secondary | ICD-10-CM | POA: Insufficient documentation

## 2024-07-05 DIAGNOSIS — W540XXA Bitten by dog, initial encounter: Secondary | ICD-10-CM | POA: Insufficient documentation

## 2024-07-05 DIAGNOSIS — S51811A Laceration without foreign body of right forearm, initial encounter: Secondary | ICD-10-CM | POA: Insufficient documentation

## 2024-07-05 DIAGNOSIS — Z2914 Encounter for prophylactic rabies immune globin: Secondary | ICD-10-CM | POA: Insufficient documentation

## 2024-07-05 MED ORDER — LIDOCAINE-EPINEPHRINE (PF) 2 %-1:200000 IJ SOLN
10.0000 mL | Freq: Once | INTRAMUSCULAR | Status: AC
  Administered 2024-07-05: 10 mL via INTRADERMAL
  Filled 2024-07-05: qty 20

## 2024-07-05 MED ORDER — RABIES VIRUS VACCINE, HDC IM SUSR
1.0000 mL | Freq: Once | INTRAMUSCULAR | Status: AC
  Administered 2024-07-05: 1 mL via INTRAMUSCULAR
  Filled 2024-07-05: qty 1

## 2024-07-05 MED ORDER — AMOXICILLIN-POT CLAVULANATE 875-125 MG PO TABS
1.0000 | ORAL_TABLET | Freq: Once | ORAL | Status: AC
  Administered 2024-07-05: 1 via ORAL
  Filled 2024-07-05: qty 1

## 2024-07-05 MED ORDER — RABIES IMMUNE GLOBULIN 1500 UNIT/10ML IJ SOLN
2700.0000 [IU] | Freq: Once | INTRAMUSCULAR | Status: AC
  Administered 2024-07-05: 2700 [IU] via INTRAMUSCULAR
  Filled 2024-07-05: qty 20

## 2024-07-05 MED ORDER — OXYCODONE-ACETAMINOPHEN 5-325 MG PO TABS
1.0000 | ORAL_TABLET | ORAL | Status: DC | PRN
  Administered 2024-07-05: 1 via ORAL
  Filled 2024-07-05: qty 1

## 2024-07-05 NOTE — ED Provider Notes (Signed)
 " [place] EMERGENCY DEPARTMENT AT [facility] Provider Note   CSN: [id number] Arrival date & time: 07/05/24  1844     Patient presents with: Animal Bite  HPI Tiffany Randolph is a 29 y.o. female presenting for dog bite.  This occurred earlier this evening.  Bit in the right forearm.  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  She did not know to [place] and unsure of the vaccination status of the dog or who the dog or owner might be.  There was some bleeding but controlled with direct pressure.  She denies any numbness or weakness in her arm.  Denies any other trauma.    Animal Bite      Prior to Admission medications  Medication Sig Start Date End Date Taking? Authorizing Provider  Naltrexone-buPROPion HCl ER (CONTRAVE) 8-90 MG TB12 Take by mouth.    [provider]  phentermine (ADIPEX-P) 37.5 MG tablet Take 37.5 mg by mouth daily. 07/17/22   [provider]    Allergies: Patient has no known allergies.    Review of Systems See HPI   Physical Exam   Vitals:   07/05/24 1909 07/05/24 2125  BP: 112/86 133/88  Pulse: (!) 102 81  Resp: 17 18  Temp: 98.8 F (37.1 C)   SpO2: 100% 100%    CONSTITUTIONAL:  well-appearing, NAD NEURO:  Alert and oriented x 3, CN 3-12 grossly intact EYES:  eyes equal and reactive ENT/NECK:  Supple, no stridor CARDIO:  regular rate and rhythm, appears well-perfused, radial pulses are 2+. PULM:  No respiratory distress, CTAB GI/GU:  non-distended, soft MSK/SPINE:  No gross deformities, no edema, moves all extremities  SKIN: Approximate 6 cm gaping laceration to the volar aspect of the right mid forearm, not actively bleeding, puncture wound just lateral to the laceration.  Open but not bleeding.  *Additional and/or pertinent findings included in MDM below   (all labs ordered are listed, but only abnormal results are displayed) Labs Reviewed   PREGNANCY, URINE    EKG: None  Radiology: No results found.   .Laceration Repair  Date/Time: 07/05/2024 10:52 PM  Performed by: Lang Norleen POUR, PA-C Authorized by: Lang Norleen POUR, PA-C   Consent:    Consent obtained:  Verbal   Consent given by:  Patient   Risks discussed:  Infection, retained foreign body, pain and poor cosmetic result   Alternatives discussed:  No treatment Universal protocol:    Procedure explained and questions answered to patient or proxy's satisfaction: yes     Relevant documents present and verified: yes     Test results available: yes     Imaging studies available: yes     Required blood products, implants, devices, and special equipment available: yes     Patient identity confirmed:  Verbally with patient and hospital-assigned identification number Anesthesia:    Anesthesia method:  Local infiltration   Local anesthetic:  Lidocaine  1% WITH epi Laceration details:    Location:  Shoulder/arm   Shoulder/arm location:  R lower arm   Length (cm):  6   Depth (mm):  8 Pre-procedure details:    Preparation:  Patient was prepped and draped in usual sterile fashion Exploration:    Hemostasis achieved with:  Direct pressure   Imaging obtained: x-ray     Imaging outcome: foreign body not noted     Wound exploration: wound explored through full range  of motion     Wound extent: areolar tissue violated     Contaminated: no   Treatment:    Area cleansed with:  Saline   Amount of cleaning:  Extensive   Irrigation solution:  Sterile saline   Irrigation volume:  1 liter   Irrigation method:  Pressure wash   Debridement:  None   Undermining:  None Skin repair:    Repair method:  Sutures   Suture size:  3-0   Suture material:  Nylon   Suture technique:  Simple interrupted   Number of sutures:  3 Approximation:    Approximation:  Loose Repair type:    Repair type:  Complex Post-procedure details:    Dressing:  Non-adherent dressing   Procedure  completion:  Tolerated well, no immediate complications    Medications Ordered in the ED  Rabies Immune Globulin  SOLN 2,700 Units (has no administration in time range)  oxyCODONE -acetaminophen  (PERCOCET/ROXICET) 5-325 MG per tablet 1 tablet (1 tablet Oral Given 07/05/24 2148)  amoxicillin -clavulanate (AUGMENTIN ) 875-125 MG per tablet 1 tablet (has no administration in time range)  rabies vaccine , human diploid (IMOVAX) injection 1 mL (1 mL Intramuscular Given 07/05/24 2215)  lidocaine -EPINEPHrine  (XYLOCAINE  W/EPI) 2 %-1:200000 (PF) injection 10 mL (10 mLs Intradermal Given 07/05/24 2217)                                    Medical Decision Making Amount and/or Complexity of Data Reviewed Radiology: ordered.  Risk Prescription drug management.   29 yo well appearing female presenting for dog bite.  Exam notable for gaping 6 to 7 cm laceration to the right volar forearm with adjacent puncture wound.  Since the vaccination status is unknown and if his dog is domestic or feral, went ahead and treated prophylactically for rabies prevention.  Gave rabies Ig and vaccination.  Started her on Augmentin .  Provided her a schedule of when to return for subsequent vaccines.  Discussed pertinent return precautions.  Advised her to follow-up with her PCP and discussed appropriate wound care at home.  Discharged in good condition.  Recommended Tylenol  and ibuprofen for pain.     Final diagnoses:  Dog bite, initial encounter  Laceration of right forearm, initial encounter    ED Discharge Orders     None          Lang Norleen POUR, PA-C 07/05/24 2300    Dreama Longs, MD 07/06/24 1217  "

## 2024-07-05 NOTE — Discharge Instructions (Addendum)
 Evaluation for dog bite was overall reassuring.  As we discussed I am starting on Augmentin  which is an antibiotic for treatment.  Please return to urgent care per the schedule provided or the emergency department for subsequent vaccines over the next 2 weeks.  If there is any concern for infection, increased redness, swelling pustular oozing or you develop a fever please return to the ED for further evaluation.  He can take Tylenol  and ibuprofen for pain.  Sutured repair Keep the laceration site dry for the next 24 hours and leave the dressing in place. After 24 hours you may remove the dressing and gently clean the laceration site with antibacterial soap and warm water. Do not scrub the area. Do not soak the area and water for long periods of time. Dont use hydrogen peroxide, iodine-based solutions, or alcohol, which can slow healing, and will probably be painful! Apply topical bacitracin 1-2 times per day for the next 3-5 days. Return to the emergency department in 6- 8 days for removal of the sutures.  You should return sooner for any signs of infection which would include increased redness around the wound, increased swelling, new drainage of yellow pus.

## 2024-07-05 NOTE — ED Provider Triage Note (Signed)
 Emergency Medicine Provider Triage Evaluation Note  Tiffany Randolph , a 29 y.o. female  was evaluated in triage.  Pt complains of dog bite to the right forearm.  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  Unknown vaccine status of the dog.  No fever or chills.  Review of Systems  Positive:  Negative: See above   Physical Exam  LMP 06/28/2024  Gen:   Awake, no distress   Resp:  Normal effort  MSK:   Moves extremities without difficulty  Other:  Gaping laceration approximately 6 cm in length to the posterior aspect of the right forearm  Medical Decision Making  Medically screening exam initiated at 7:09 PM.  Appropriate orders placed.  Etta Gassett was informed that the remainder of the evaluation will be completed by another provider, this initial triage assessment does not replace that evaluation, and the importance of remaining in the ED until their evaluation is complete.     Theotis Peers [place], [place] 07/05/24 1911

## 2024-07-05 NOTE — ED Triage Notes (Addendum)
 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 Approximate 2 inch length wound on right arm. Unsure of vaccination status

## 2024-07-06 MED ORDER — AMOXICILLIN-POT CLAVULANATE 875-125 MG PO TABS: 1.0000 | ORAL_TABLET | Freq: Two times a day (BID) | ORAL | 0 refills | Status: AC

## 2024-07-08 ENCOUNTER — Ambulatory Visit (HOSPITAL_COMMUNITY)
Admission: EM | Admit: 2024-07-08 | Discharge: 2024-07-08 | Disposition: A | Discharge status: Home / Self Care | Payer: Self-pay

## 2024-07-08 ENCOUNTER — Encounter (HOSPITAL_COMMUNITY): Payer: Self-pay

## 2024-07-08 DIAGNOSIS — Z23 Encounter for immunization: Secondary | ICD-10-CM

## 2024-07-08 DIAGNOSIS — Z203 Contact with and (suspected) exposure to rabies: Secondary | ICD-10-CM

## 2024-07-08 MED ORDER — RABIES VIRUS VACCINE, HDC IM SUSR
INTRAMUSCULAR | Status: AC
  Filled 2024-07-08: qty 1

## 2024-07-08 MED ORDER — RABIES VIRUS VACCINE, HDC IM SUSR
1.0000 mL | Freq: Once | INTRAMUSCULAR | Status: AC
  Administered 2024-07-08: 1 mL via INTRAMUSCULAR

## 2024-07-08 NOTE — ED Triage Notes (Addendum)
 Pt is here today for 2nd rabies vaccine . Pt denies reaction to 1st rabies vaccine  which was given on 07/05/24

## 2024-07-12 ENCOUNTER — Ambulatory Visit (HOSPITAL_COMMUNITY)
Admission: EM | Admit: 2024-07-12 | Discharge: 2024-07-12 | Disposition: A | Discharge status: Home / Self Care | Payer: Self-pay | Attending: Family Medicine | Admitting: Family Medicine

## 2024-07-12 DIAGNOSIS — Z203 Contact with and (suspected) exposure to rabies: Secondary | ICD-10-CM

## 2024-07-12 MED ORDER — RABIES VIRUS VACCINE, HDC IM SUSR
INTRAMUSCULAR | Status: AC
  Filled 2024-07-12: qty 1

## 2024-07-12 MED ORDER — RABIES VIRUS VACCINE, HDC IM SUSR
1.0000 mL | Freq: Once | INTRAMUSCULAR | Status: AC
  Administered 2024-07-12: 1 mL via INTRAMUSCULAR

## 2024-07-12 NOTE — ED Triage Notes (Signed)
 Pt here for 3rd rabies vaccine . Denies any concerns.

## 2024-07-19 ENCOUNTER — Ambulatory Visit (HOSPITAL_COMMUNITY)
Admission: EM | Admit: 2024-07-19 | Discharge: 2024-07-19 | Disposition: A | Discharge status: Home / Self Care | Payer: Self-pay

## 2024-07-19 ENCOUNTER — Encounter (HOSPITAL_COMMUNITY): Payer: Self-pay

## 2024-07-19 DIAGNOSIS — Z23 Encounter for immunization: Secondary | ICD-10-CM

## 2024-07-19 DIAGNOSIS — Z203 Contact with and (suspected) exposure to rabies: Secondary | ICD-10-CM

## 2024-07-19 MED ORDER — RABIES VIRUS VACCINE, HDC IM SUSR
1.0000 mL | Freq: Once | INTRAMUSCULAR | Status: AC
  Administered 2024-07-19: 1 mL via INTRAMUSCULAR

## 2024-07-19 MED ORDER — RABIES VIRUS VACCINE, HDC IM SUSR
INTRAMUSCULAR | Status: AC
  Filled 2024-07-19: qty 1

## 2024-07-19 NOTE — ED Triage Notes (Signed)
 Pt here to get last rabies vaccination. Reports getting n/v after injection.

## 2024-08-04 ENCOUNTER — Encounter (HOSPITAL_COMMUNITY): Payer: Self-pay | Admitting: Emergency Medicine

## 2024-08-04 ENCOUNTER — Ambulatory Visit (HOSPITAL_COMMUNITY)
Admission: EM | Admit: 2024-08-04 | Discharge: 2024-08-04 | Disposition: A | Discharge status: Home / Self Care | Payer: Self-pay | Source: Home / Self Care

## 2024-08-04 ENCOUNTER — Ambulatory Visit (HOSPITAL_COMMUNITY): Payer: Self-pay

## 2024-08-04 DIAGNOSIS — S41151D Open bite of right upper arm, subsequent encounter: Secondary | ICD-10-CM

## 2024-08-04 DIAGNOSIS — W540XXD Bitten by dog, subsequent encounter: Secondary | ICD-10-CM

## 2024-08-04 DIAGNOSIS — Z4802 Encounter for removal of sutures: Secondary | ICD-10-CM

## 2024-08-04 MED ORDER — MUPIROCIN 2 % EX OINT: 1.0000 | TOPICAL_OINTMENT | Freq: Two times a day (BID) | CUTANEOUS | 0 refills | Status: AC

## 2024-08-04 NOTE — ED Triage Notes (Signed)
 Pt here to get sutures out of right arm that had put in ED Jul 04, 2024. Pt reports that she was never told that sutures needed to be removed. Patient report itching. Does have pain at site.

## 2024-08-04 NOTE — ED Provider Notes (Signed)
 "   [facility]    CSN: [id number] Arrival date & time: 08/04/24  1606      History   Chief Complaint Chief Complaint  Patient presents with   Suture / Staple Removal    HPI Tiffany Randolph is a 29 y.o. female.   Tiffany Randolph is a 29 y.o. female presenting for chief complaint of Suture / Staple Removal.  Patient had 3 sutures placed to right dog bite injury wound on July 05, 2024 in the emergency department.  She claims she was never told that she needed sutures removed.  She is experiencing itching to the site of the sutures and would like to have them taken out today.  Sutures have been in the skin for 1 month.  She took Augmentin  as prescribed after initial dog bite injury and has not noticed any pus, swelling, redness, or drainage since finishing Augmentin .     Past Medical History:  Diagnosis Date   Asthma     Patient Active Problem List   Diagnosis Date Noted   Vitamin D  deficiency 12/10/2014   Surveillance of implantable subdermal contraceptive 11/30/2014   BMI (body mass index), pediatric, greater than or equal to 95% for age 74/25/2016   Failed vision screen 10/22/2014   Failed hearing screening 10/22/2014   Sleep difficulties 10/22/2014    History reviewed. No pertinent surgical history.  OB History   No obstetric history on file.      Home Medications    Prior to Admission medications  Medication Sig Start Date End Date Taking? Authorizing Provider  mupirocin  ointment (BACTROBAN ) 2 % Apply 1 Application topically 2 (two) times daily. 08/04/24  Yes Enedelia Dorna HERO, FNP  Naltrexone-buPROPion HCl ER (CONTRAVE) 8-90 MG TB12 Take by mouth.    [provider]  phentermine (ADIPEX-P) 37.5 MG tablet Take 37.5 mg by mouth daily. 07/17/22   [provider]    Family History No family history on file.  Social History Social History[1]   Allergies   Latex   Review of Systems Review of Systems Per HPI  Physical  Exam Triage Vital Signs ED Triage Vitals  Encounter Vitals Group     BP 08/04/24 1629 105/72     Girls Systolic BP Percentile --      Girls Diastolic BP Percentile --      Boys Systolic BP Percentile --      Boys Diastolic BP Percentile --      Pulse Rate 08/04/24 1629 72     Resp 08/04/24 1629 17     Temp 08/04/24 1629 98.2 F (36.8 C)     Temp Source 08/04/24 1629 Oral     SpO2 08/04/24 1629 98 %     Weight --      Height --      Head Circumference --      Peak Flow --      Pain Score 08/04/24 1628 5     Pain Loc --      Pain Education --      Exclude from Growth Chart --    No data found.  Updated Vital Signs BP 105/72 (BP Location: Left Arm)   Pulse 72   Temp 98.2 F (36.8 C) (Oral)   Resp 17   LMP 07/21/2024 (Approximate)   SpO2 98%   Visual Acuity Right Eye Distance:   Left Eye Distance:   Bilateral Distance:    Right Eye Near:   Left Eye Near:  Bilateral Near:     Physical Exam Vitals and nursing note reviewed.  Constitutional:      Appearance: She is not ill-appearing or toxic-appearing.  HENT:     Head: Normocephalic and atraumatic.     Right Ear: Hearing and external ear normal.     Left Ear: Hearing and external ear normal.     Nose: Nose normal.     Mouth/Throat:     Lips: Pink.  Eyes:     General: Lids are normal. Vision grossly intact. Gaze aligned appropriately.     Extraocular Movements: Extraocular movements intact.     Conjunctiva/sclera: Conjunctivae normal.  Pulmonary:     Effort: Pulmonary effort is normal.  Musculoskeletal:     Cervical back: Neck supple.  Skin:    General: Skin is warm and dry.     Capillary Refill: Capillary refill takes less than 2 seconds.     Findings: Laceration (Laceration to the right forearm with 3 sutures present.  Scant yellow drainage to the wound.  No surrounding soft tissue swelling, erythema, pain, or warmth.) present. No rash.     Comments: See image of laceration below.  Neurological:      General: No focal deficit present.     Mental Status: She is alert and oriented to person, place, and time. Mental status is at baseline.     Cranial Nerves: No dysarthria or facial asymmetry.  Psychiatric:        Mood and Affect: Mood normal.        Speech: Speech normal.        Behavior: Behavior normal.        Thought Content: Thought content normal.        Judgment: Judgment normal.    Right forearm laceration   UC Treatments / Results  Labs (all labs ordered are listed, but only abnormal results are displayed) Labs Reviewed - No data to display  EKG   Radiology No results found.  Procedures Procedures (including critical care time)  Medications Ordered in UC Medications - No data to display  Initial Impression / Assessment and Plan / UC Course  I have reviewed the triage vital signs and the nursing notes.  Pertinent labs & imaging results that were available during my care of the patient were reviewed by me and considered in my medical decision making (see chart for details).   1.  Encounter for removal of sutures, dog bite of right upper extremity subsequent encounter Sutures removed by nursing staff.  3 sutures placed at time of encounter on chart review, 3 sutures are present today on exam. Mupirocin  twice daily for 7 days.  No indication for further oral antibiotics. Infection precautions discussed. Follow-up with PCP as needed.  Counseled patient on potential for adverse effects with medications prescribed/recommended today, strict ER and return-to-clinic precautions discussed, patient verbalized understanding.    Final Clinical Impressions(s) / UC Diagnoses   Final diagnoses:  Encounter for removal of sutures  Dog bite of right upper extremity, subsequent encounter     Discharge Instructions      We removed your sutures today. Use mupirocin  ointment to the wound every 12 hours for the next 7 days.  Watch for new/worsening signs of infection such as  redness, swelling, pus, and pain. Follow-up with PCP as needed.    ED Prescriptions     Medication Sig Dispense Auth. Provider   mupirocin  ointment (BACTROBAN ) 2 % Apply 1 Application topically 2 (two) times daily. 22 g  Enedelia Dorna HERO, FNP      PDMP not reviewed this encounter.    [1]  Social History Tobacco Use   Smoking status: Never   Smokeless tobacco: Never  Substance Use Topics   Alcohol use: No    Alcohol/week: 0.0 standard drinks of alcohol   Drug use: No     Enedelia Dorna HERO, FNP 08/04/24 1659  "

## 2024-08-04 NOTE — Discharge Instructions (Signed)
 We removed your sutures today. Use mupirocin  ointment to the wound every 12 hours for the next 7 days.  Watch for new/worsening signs of infection such as redness, swelling, pus, and pain. Follow-up with PCP as needed.
